# Patient Record
Sex: Male | Born: 1945 | Race: Black or African American | Hispanic: No | Marital: Single | Smoking: Former smoker
Health system: Southern US, Community
[De-identification: ages and names within clinical notes are randomized; demographics above are authoritative.]

## PROBLEM LIST (undated history)

## (undated) DIAGNOSIS — Z8546 Personal history of malignant neoplasm of prostate: Secondary | ICD-10-CM

## (undated) DIAGNOSIS — R32 Unspecified urinary incontinence: Secondary | ICD-10-CM

## (undated) DIAGNOSIS — J439 Emphysema, unspecified: Secondary | ICD-10-CM

## (undated) DIAGNOSIS — K7460 Unspecified cirrhosis of liver: Secondary | ICD-10-CM

## (undated) DIAGNOSIS — E785 Hyperlipidemia, unspecified: Secondary | ICD-10-CM

## (undated) DIAGNOSIS — M1990 Unspecified osteoarthritis, unspecified site: Secondary | ICD-10-CM

## (undated) DIAGNOSIS — D509 Iron deficiency anemia, unspecified: Secondary | ICD-10-CM

## (undated) DIAGNOSIS — M5137 Other intervertebral disc degeneration, lumbosacral region: Secondary | ICD-10-CM

## (undated) DIAGNOSIS — M51379 Other intervertebral disc degeneration, lumbosacral region without mention of lumbar back pain or lower extremity pain: Secondary | ICD-10-CM

## (undated) HISTORY — PX: INGUINAL HERNIA REPAIR: SUR1180

## (undated) HISTORY — DX: Unspecified cirrhosis of liver: K74.60

## (undated) HISTORY — DX: Iron deficiency anemia, unspecified: D50.9

---

## 1998-03-20 ENCOUNTER — Emergency Department (HOSPITAL_COMMUNITY): Admission: EM | Admit: 1998-03-20 | Discharge: 1998-03-20 | Payer: Self-pay | Admitting: Emergency Medicine

## 1998-08-25 ENCOUNTER — Encounter: Payer: Self-pay | Admitting: Emergency Medicine

## 1998-08-26 ENCOUNTER — Inpatient Hospital Stay (HOSPITAL_COMMUNITY): Admission: EM | Admit: 1998-08-26 | Discharge: 1998-08-27 | Payer: Self-pay | Admitting: Emergency Medicine

## 1998-08-26 ENCOUNTER — Encounter: Payer: Self-pay | Admitting: Gastroenterology

## 2000-07-18 ENCOUNTER — Ambulatory Visit (HOSPITAL_COMMUNITY): Admission: RE | Admit: 2000-07-18 | Discharge: 2000-07-19 | Payer: Self-pay | Admitting: Neurosurgery

## 2000-07-18 ENCOUNTER — Encounter: Payer: Self-pay | Admitting: Neurosurgery

## 2000-07-18 HISTORY — PX: ANTERIOR CERVICAL DECOMP/DISCECTOMY FUSION: SHX1161

## 2000-09-20 ENCOUNTER — Encounter: Payer: Self-pay | Admitting: Neurosurgery

## 2000-09-20 ENCOUNTER — Ambulatory Visit (HOSPITAL_COMMUNITY): Admission: RE | Admit: 2000-09-20 | Discharge: 2000-09-20 | Payer: Self-pay | Admitting: Neurosurgery

## 2000-09-30 ENCOUNTER — Emergency Department (HOSPITAL_COMMUNITY): Admission: EM | Admit: 2000-09-30 | Discharge: 2000-09-30 | Payer: Self-pay | Admitting: Emergency Medicine

## 2000-09-30 ENCOUNTER — Encounter: Payer: Self-pay | Admitting: Emergency Medicine

## 2001-10-31 ENCOUNTER — Emergency Department (HOSPITAL_COMMUNITY): Admission: EM | Admit: 2001-10-31 | Discharge: 2001-10-31 | Payer: Self-pay

## 2001-11-02 ENCOUNTER — Emergency Department (HOSPITAL_COMMUNITY): Admission: EM | Admit: 2001-11-02 | Discharge: 2001-11-02 | Payer: Self-pay | Admitting: Emergency Medicine

## 2001-11-09 ENCOUNTER — Emergency Department (HOSPITAL_COMMUNITY): Admission: EM | Admit: 2001-11-09 | Discharge: 2001-11-09 | Payer: Self-pay | Admitting: Emergency Medicine

## 2003-05-20 ENCOUNTER — Ambulatory Visit (HOSPITAL_COMMUNITY): Admission: RE | Admit: 2003-05-20 | Discharge: 2003-05-20 | Payer: Self-pay | Admitting: Orthopedic Surgery

## 2003-05-20 ENCOUNTER — Ambulatory Visit (HOSPITAL_BASED_OUTPATIENT_CLINIC_OR_DEPARTMENT_OTHER): Admission: RE | Admit: 2003-05-20 | Discharge: 2003-05-20 | Payer: Self-pay | Admitting: Orthopedic Surgery

## 2003-05-20 HISTORY — PX: WRIST ARTHROSCOPY WITH DEBRIDEMENT: SHX6194

## 2004-11-27 ENCOUNTER — Emergency Department (HOSPITAL_COMMUNITY): Admission: EM | Admit: 2004-11-27 | Discharge: 2004-11-27 | Payer: Self-pay | Admitting: Emergency Medicine

## 2006-01-30 HISTORY — PX: KNEE ARTHROSCOPY: SUR90

## 2006-09-24 ENCOUNTER — Encounter (HOSPITAL_COMMUNITY): Admission: RE | Admit: 2006-09-24 | Discharge: 2006-10-24 | Payer: Self-pay | Admitting: Urology

## 2006-12-05 ENCOUNTER — Encounter (INDEPENDENT_AMBULATORY_CARE_PROVIDER_SITE_OTHER): Payer: Self-pay | Admitting: Urology

## 2006-12-05 ENCOUNTER — Observation Stay (HOSPITAL_COMMUNITY): Admission: RE | Admit: 2006-12-05 | Discharge: 2006-12-06 | Payer: Self-pay | Admitting: Urology

## 2006-12-05 HISTORY — PX: ROBOT ASSISTED LAPAROSCOPIC RADICAL PROSTATECTOMY: SHX5141

## 2007-12-11 ENCOUNTER — Ambulatory Visit (HOSPITAL_COMMUNITY): Admission: RE | Admit: 2007-12-11 | Discharge: 2007-12-11 | Payer: Self-pay | Admitting: *Deleted

## 2007-12-19 ENCOUNTER — Ambulatory Visit (HOSPITAL_BASED_OUTPATIENT_CLINIC_OR_DEPARTMENT_OTHER): Admission: RE | Admit: 2007-12-19 | Discharge: 2007-12-19 | Payer: Self-pay | Admitting: *Deleted

## 2008-08-07 ENCOUNTER — Encounter: Admission: RE | Admit: 2008-08-07 | Discharge: 2008-08-07 | Payer: Self-pay | Admitting: Surgery

## 2008-08-11 ENCOUNTER — Ambulatory Visit (HOSPITAL_BASED_OUTPATIENT_CLINIC_OR_DEPARTMENT_OTHER): Admission: RE | Admit: 2008-08-11 | Discharge: 2008-08-11 | Payer: Self-pay | Admitting: Surgery

## 2008-10-26 ENCOUNTER — Ambulatory Visit (HOSPITAL_COMMUNITY): Admission: RE | Admit: 2008-10-26 | Discharge: 2008-10-26 | Payer: Self-pay | Admitting: Pulmonary Disease

## 2008-12-01 ENCOUNTER — Observation Stay (HOSPITAL_COMMUNITY): Admission: RE | Admit: 2008-12-01 | Discharge: 2008-12-02 | Payer: Self-pay | Admitting: Urology

## 2008-12-01 HISTORY — PX: OTHER SURGICAL HISTORY: SHX169

## 2009-01-29 ENCOUNTER — Emergency Department (HOSPITAL_COMMUNITY): Admission: EM | Admit: 2009-01-29 | Discharge: 2009-01-29 | Payer: Self-pay | Admitting: Emergency Medicine

## 2009-05-01 ENCOUNTER — Ambulatory Visit (HOSPITAL_COMMUNITY): Admission: RE | Admit: 2009-05-01 | Discharge: 2009-05-01 | Payer: Self-pay | Admitting: Pulmonary Disease

## 2009-07-14 ENCOUNTER — Emergency Department (HOSPITAL_COMMUNITY): Admission: EM | Admit: 2009-07-14 | Discharge: 2009-07-14 | Payer: Self-pay | Admitting: Emergency Medicine

## 2010-05-04 LAB — HEMOGLOBIN AND HEMATOCRIT, BLOOD
HCT: 32.8 % — ABNORMAL LOW (ref 39.0–52.0)
Hemoglobin: 11 g/dL — ABNORMAL LOW (ref 13.0–17.0)

## 2010-05-05 LAB — BASIC METABOLIC PANEL
BUN: 7 mg/dL (ref 6–23)
CO2: 31 mEq/L (ref 19–32)
Calcium: 9.4 mg/dL (ref 8.4–10.5)
Chloride: 105 mEq/L (ref 96–112)
Creatinine, Ser: 0.68 mg/dL (ref 0.4–1.5)
GFR calc Af Amer: >60 mL/min (ref >60)
GFR calc non Af Amer: >60 mL/min (ref >60)
Glucose, Bld: 84 mg/dL (ref 70–99)
Potassium: 4.3 mEq/L (ref 3.5–5.1)
Sodium: 141 mEq/L (ref 135–145)

## 2010-05-05 LAB — CBC
HCT: 36.5 % — ABNORMAL LOW (ref 39.0–52.0)
Hemoglobin: 12 g/dL — ABNORMAL LOW (ref 13.0–17.0)
MCHC: 32.9 g/dL (ref 30.0–36.0)
MCV: 90.4 fL (ref 78.0–100.0)
Platelets: 267 10*3/uL (ref 150–400)
RBC: 4.04 MIL/uL — ABNORMAL LOW (ref 4.22–5.81)
RDW: 14.1 % (ref 11.5–15.5)
WBC: 5.5 10*3/uL (ref 4.0–10.5)

## 2010-05-05 LAB — TYPE AND SCREEN
ABO/RH(D): A POS
Antibody Screen: NEGATIVE

## 2010-05-05 LAB — PROTIME-INR
INR: 0.99 (ref 0.00–1.49)
Prothrombin Time: 13 seconds (ref 11.6–15.2)

## 2010-05-05 LAB — APTT: aPTT: 37 seconds (ref 24–37)

## 2010-05-08 LAB — DIFFERENTIAL
Basophils Absolute: 0 10*3/uL (ref 0.0–0.1)
Basophils Relative: 0 % (ref 0–1)
Eosinophils Absolute: 0.2 10*3/uL (ref 0.0–0.7)
Eosinophils Relative: 4 % (ref 0–5)
Lymphocytes Relative: 31 % (ref 12–46)
Lymphs Abs: 1.5 10*3/uL (ref 0.7–4.0)
Monocytes Absolute: 0.3 10*3/uL (ref 0.1–1.0)
Monocytes Relative: 6 % (ref 3–12)
Neutro Abs: 2.9 10*3/uL (ref 1.7–7.7)
Neutrophils Relative %: 59 % (ref 43–77)

## 2010-05-08 LAB — CBC
HCT: 35.4 % — ABNORMAL LOW (ref 39.0–52.0)
Hemoglobin: 11.9 g/dL — ABNORMAL LOW (ref 13.0–17.0)
MCHC: 33.5 g/dL (ref 30.0–36.0)
MCV: 87.9 fL (ref 78.0–100.0)
Platelets: 266 10*3/uL (ref 150–400)
RBC: 4.03 MIL/uL — ABNORMAL LOW (ref 4.22–5.81)
RDW: 13.9 % (ref 11.5–15.5)
WBC: 4.9 10*3/uL (ref 4.0–10.5)

## 2010-05-08 LAB — BASIC METABOLIC PANEL
BUN: 6 mg/dL (ref 6–23)
CO2: 30 mEq/L (ref 19–32)
Calcium: 8.9 mg/dL (ref 8.4–10.5)
Chloride: 103 mEq/L (ref 96–112)
Creatinine, Ser: 0.78 mg/dL (ref 0.4–1.5)
GFR calc Af Amer: >60 mL/min (ref >60)
GFR calc non Af Amer: >60 mL/min (ref >60)
Glucose, Bld: 87 mg/dL (ref 70–99)
Potassium: 3.4 mEq/L — ABNORMAL LOW (ref 3.5–5.1)
Sodium: 141 mEq/L (ref 135–145)

## 2010-06-14 NOTE — Op Note (Signed)
[name], [name]              ACCOUNT NO.:  [id number]   MEDICAL RECORD NO.:  [id number]          PATIENT TYPE:  AMB   LOCATION:  NESC                         FACILITY:  [facility]   PHYSICIAN:  Alfonse Ras, MD   DATE OF BIRTH:  [birth year]   DATE OF PROCEDURE:  12/19/2007  DATE OF DISCHARGE:                               OPERATIVE REPORT   PREOPERATIVE DIAGNOSIS:  Left inguinal hernia.   POSTOPERATIVE DIAGNOSIS:  Left inguinal hernia, indirect.   PROCEDURE:  Left inguinal hernia repair with high ligation and placement  of 3 x 6 Ultrapro mesh.   ANESTHESIA:  General laryngeal mass.   SURGEON:  Baruch Merl, MD.   DESCRIPTION OF PROCEDURE:  The patient was taken to the operating room,  placed in a supine position.  After adequate general anesthesia was  induced using laryngeal mask, the left abdomen was prepped and draped in  the normal sterile fashion.  Using an oblique incision from the pubic  tubercle obliquely out lateral I dissected down to the skin and  subcutaneous tissue with Bovie electrocautery.  The external oblique  fascia was opened along its fibers.  The ilioinguinal nerve was  identified and retracted medially.  The spermatic cord was surrounded at  the external ring with a Penrose drain.  Indirect hernia sac was  identified and dissected off the cord and ligated at the internal ring  with a 0 Surgilon suture.  The internal ring was closed with an  interrupted 0 Surgilon suture approximating the transversalis fascia to  the inguinal ligament.  The Ultrapro mesh was then placed over the floor  of Hesselbach triangle and sutured from the pubic tubercle along the  inguinal ligament out lateral to the internal ring and superior medially  to the transversalis fascia with running 2-0 Prolene sutures.  The mesh  was split and brought out lateral to the internal ring.  The  ilioinguinal nerve was returned to its normal anatomic position.  The  external oblique fascia  was closed with a running 3-0 Vicryl.  All  tissues were injected with 0.5 Marcaine.  Skin was closed with staples.  Sterile dressing was applied.  The patient tolerated the procedure well,  went to PACU in good condition.      Alfonse Ras, MD  Electronically Signed     KRE/MEDQ  D:  12/19/2007  T:  12/20/2007  Job:  [id number]

## 2010-06-14 NOTE — Op Note (Signed)
NAMECHING, RABIDEAU              ACCOUNT NO.:  [id number]   MEDICAL RECORD NO.:  [id number]          PATIENT TYPE:  INP   LOCATION:  0006                         FACILITY:  [facility]   PHYSICIAN:  Valetta Fuller, M.D.  DATE OF BIRTH:  February 22, 1945   DATE OF PROCEDURE:  12/05/2006  DATE OF DISCHARGE:                               OPERATIVE REPORT   PREOPERATIVE DIAGNOSIS:  Clinical stage T1C adenocarcinoma of the  prostate.   POSTOPERATIVE DIAGNOSIS:  Clinical stage T1C adenocarcinoma of the  prostate.   PROCEDURE PERFORMED:  Robotic-assisted laparoscopic radical retropubic  prostatectomy with bilateral pelvic lymph node dissection.   SURGEON:  Valetta Fuller, M.D.   ASSISTANT:  Lucrezia Starch. Earlene Plater, M.D.   ANESTHESIA:  General endotracheal.   INDICATIONS:  Mr. Corkery is a 65 year old male.  He was diagnosed with  clinical stage T1C adenocarcinoma prostate by Dr. Marcine Matar.  The patient had a moderately elevated PSA of 8.1 with an unremarkable  digital rectal exam.  The patient had an ultrasound which showed a 20-25  gram prostate.  Pathology was mostly notable for positive biopsies in  the right side of his prostate.  He had Gleason 3 + 4 = 7 tumor of  moderate volume involving the right base, right mid and right apex.  The  left-sided biopsies were negative with the exception of the left apex  where a small less than 5% of the material was noted to have a Gleason 7  tumor.  The patient underwent extensive consultation with Dr. Retta Diones  and also myself about his treatment options.  He appeared to understand  the advantages and disadvantages of the various approaches.  He appeared  understand the issues related to radical surgery, especially with  regards to incontinence and sexual dysfunction.  All risks and benefits  were discussed, and full informed consent was obtained.  The patient has  received perioperative Unasyn and as well as compression stockings and  has done  a mechanical bowel prep.   TECHNIQUE AND FINDINGS:  The patient was brought to the operating room  where he had successful induction of general endotracheal anesthesia.  He was placed in the mid lithotomy position and prepped and draped in  the usual manner.  The patient's extremities were all carefully padded,  and he was secured to the operating table.  He was then placed in steep  Trendelenburg position and prepped and draped in the usual manner.  The  patient was noted to have fairly significant phimosis, but the foreskin  was retractable, and a Foley catheter was inserted sterilely on the  field.   The initial trocar site was placed through an open standard Hasson  technique.  Initial site was 18 cm above the pubic symphysis just to the  left of the umbilicus.  The peritoneal cavity was entered without  difficulty, and no obvious adhesions were noted.  Trocar was placed, and  the abdomen was insufflated without incident.  All other trocars were  placed with direct visual guidance including 12-mm and 5-mm assist ports  and three 8-mm robotic trocars.  The  surgical cart was then docked.   The bladder was then filled.  Incision was made superior to the bladder  utilizing hot electrocautery scissors, allowing for the bladder to drop  and for identification and dissection of the space of Retzius.  Endopelvic fascia over the prostate was defatted.  Endopelvic fascia was  then incised from apex to base on both sides.  Levator musculature was  swept off the apex of the prostate, and a dorsal vein complex was  isolated.  Puboprostatic ligaments were taken down sharply.  The dorsal  vein complex was then stapled with an ETS stapling device.  With a Foley  balloon, we were able to identify the anterior bladder neck which was  then incised with electrocautery scissors.  Foley catheter was found in  the midline.  There was no evidence of middle lobe.  The Foley catheter  was retracted  anteriorly.  Indigo carmine was given, and we were well  away from the orifices.  The posterior bladder neck was then incised.  The vas deferens and seminal vesicles were found bilaterally and  dissected free individually.  Clips were used on the tip of the seminal  vesicle.  The posterior plane between the prostate and rectum was then  easily established.   We felt that the patient could undergo left-sided nerve spare with wide  excision of the bundle on the right side.  The prostatic fascia was  incised in the left posterior lateral aspect.  The neurovascular bundle  was swept off the capsule the prostate from apex to base.  The prostate  was then lifted, and the pedicles were identified and taken down with  clips.  Again, the left side of the neurovascular bundle was preserved,  and we went as wide as possible on the right side, leaving more  additional tissue including the neurovascular bundle attached to the  right lateral aspect of the prostate.  With the Foley catheter in  position, the anterior urethra was then transected.  The Foley catheter  was retracted, and the urethra was completely transected leaving a nice  full-thickness stump.  A few loose attachments were then taken  carefully, and the prostate specimen was removed the pelvis.  Pelvic  irrigation was then performed.  There was no evidence of any rectal  injury.   Attention was then turned towards bilateral pelvic lymph node  dissection.  Obturator packets were taken up to the bifurcation of the  common iliac artery bilaterally.  Clips were used.  That tissue was  fairly scant, and there were no obvious pathologic lymph nodes.   Attention was then turned towards reconstruction.  Denonvilliers fascia  was reapproximated from underneath the urethra to the bladder base  utilizing Vicryl suture to help provide bladder support.  The posterior  lip of the bladder neck was then reapproximated to the 6 o'clock  urethral  stump with an interrupted Vicryl suture to reapproximate those  structures.  Again, indigo carmine was given, and we were well away from  the orifices.  The anastomosis was then completed with double-armed 3-0  Monocryl suture and a 360-degree running anastomosis.  New coude Foley  catheter was placed without difficulty.  The bladder was irrigated.  There was no bleeding, and the anastomosis appeared to be completely  watertight.  A drain was then placed in the pelvis through one of the  trocar ports.  This was secured to the skin.   Closure was then performed.  The prostate was  placed an Endopouch.  The  12-mm port site was closed with a 0 Vicryl with the aid of a suture  passer under direct visual guidance.  The midline incision/camera port  was slightly incised to allow for removal of the specimen.  That fascia  was then closed with running 0 Vicryl suture.  Other port sites were  infiltrated then closed with clips.  The patient appeared to tolerate  the procedure well.  Blood loss was minimal, and he was brought to  recovery room in stable condition.           ______________________________  Valetta Fuller, M.D.  Electronically Signed     DSG/MEDQ  D:  12/05/2006  T:  12/06/2006  Job:  [id number]

## 2010-06-14 NOTE — Op Note (Signed)
[name], [name]              ACCOUNT NO.:  [id number]   MEDICAL RECORD NO.:  [id number]          PATIENT TYPE:  AMB   LOCATION:  DSC                          FACILITY:  [facility]   PHYSICIAN:  Thomas A. Cornett, M.D.DATE OF BIRTH:  Mar 24, 1945   DATE OF PROCEDURE:  08/11/2008  DATE OF DISCHARGE:                               OPERATIVE REPORT   PREOPERATIVE DIAGNOSIS:  Right inguinal hernia.   POSTOPERATIVE DIAGNOSIS:  Indirect right inguinal hernia.   PROCEDURE:  Repair of right inguinal hernia with mesh.   SURGEON:  Maisie Fus A. Cornett, MD   ANESTHESIA:  LMA with 0.25% Sensorcaine local with epinephrine.   ESTIMATED BLOOD LOSS:  5 mL.   DRAINS:  None.   SPECIMENS:  None.   INDICATIONS FOR PROCEDURE:  The patient is a 65 year old male who  presents with a right inguinal hernia.  It is symptomatic and he wished  to have it repaired.  After discussion of the options in the office, he  wished to undergo right inguinal hernia repair as an open procedure.   We discussed the complications and risks of procedure to include  bleeding, infection, numbness, pain, hernia recurrence, testicular  discomfort or ischemia.   DESCRIPTION OF PROCEDURE:  The patient was brought to the operating room  and placed supine.  After induction of general anesthesia, the right  inguinal region was prepped and draped in sterile fashion.  An oblique  incision was made in the right inguinal crease.  Dissection was carried  down until the aponeurosis of the external oblique was encountered.  The  fibers were opened in the direction of the fibers to open the inguinal  canal.  Cord structures were then encircled with a 1-inch Penrose drain.  Dissection on the cord was then done which showed indirect sac which I  teased away from the cord and reduced it back into the preperitoneal  space without difficulty.  A large Prolene hernia system mesh was then  used with the inner leaflet placed in the preperitoneal  space and the  onlay placed in the floor of the inguinal canal.  The connecting between  the two pieces of mesh was secured to the internal oblique with 0-Vicryl  and the onlay tip was secured down to the pubic tubercle with 0-Vicryl.  #1 Novofils were used to secure the mesh to the shelving edge of the  inguinal ligament into the internal oblique and conjoined tendon.  A  slit was cut to the cord structures.  This was closed around the cord  structures and this was closed with #1 Novofil.  The mesh lay quite  nicely and closed the defect well with no tension.  There was ample room  for the tip of my fifth digit to be inserted along the cord structures.  Irrigation was used and suctioned out.  The ilioinguinal nerve was  divided as it came back to the muscle due to it being tethered along the  mesh as well as a branch of the iliohypogastric and a small branch of  this.  The general femoral nerve was not well seen.  The aponeurosis of  the external oblique was closed with 2-0 Vicryl and 3-0 Vicryl was used  to approximate the Scarpa fascia and 4-0  Monocryl was used to close the skin in a subcuticular fashion.  Dermabond was applied.  All final counts of sponge, needle, and  instrument were found to be correct at this portion of the case.  The  patient was then awoke and taken to recovery room in satisfactory  condition.      Thomas A. Cornett, M.D.  Electronically Signed     TAC/MEDQ  D:  08/11/2008  T:  08/12/2008  Job:  [id number]   cc:   Mina Marble, M.D.  Valetta Fuller, M.D.

## 2010-06-17 NOTE — Op Note (Signed)
NAME:  [name], [name]                        ACCOUNT NO.:  [id number]   MEDICAL RECORD NO.:  [id number]                   PATIENT TYPE:  AMB   LOCATION:  DSC                                  FACILITY:  [facility]   PHYSICIAN:  Artist Pais. Mina Marble, M.D.           DATE OF BIRTH:  07-Sep-1945   DATE OF PROCEDURE:  05/20/2003  DATE OF DISCHARGE:                                 OPERATIVE REPORT   PREOPERATIVE DIAGNOSES:  Right wrist triangular fibrocartilage complex tear  with retained hardware from a four-corner fusion.   POSTOPERATIVE DIAGNOSIS:  Right wrist triangular fibrocartilage complex tear  with retained hardware from a four-corner fusion.   PROCEDURE:  Right wrist arthroscopy with triangular fibrocartilage complex  tear debridement.  Open hardware removal, right wrist.   SURGEON:  Artist Pais. Mina Marble, M.D.   ASSISTANT:  __________   ANESTHESIA:  General.   TOURNIQUET TIME:  An hour.   COMPLICATIONS:  None.   DRAINS:  None.   OPERATIVE REPORT:  The patient was taken to the operating room after the  induction of adequate general anesthesia.  The right upper quadrant is  prepped and draped in the usual sterile fashion.  An Esmarch was used to  exsanguinate the limb.  The tourniquet was inflated to 250 mmHg.  At this  point in time,  the patient's right upper extremity is placed in the wrist  traction tower with 15 pounds of counter-traction across the radial carpal  joint, well padded.  A standard three-port arthroscopic portal was  established.  Once this was done, the scope was introduced into the joint.  Visualization revealed a TFCC tear.  A 4-5 working portal and a __________  portal were established under direct vision.  The TFCC tear was debrided  using a suction shaver and an ArthroCare wand down to the stable rim.  The  instruments were removed from the wrist.  The wrist was then removed from  the traction tower, fully pronated and placed on the operating room  table.  At this point in time the 3-4 portal was extended for a distance of 3 cm.  Once this was done, dissection was carried down through the 4th dorsal  compartment, which was retracted and the plate was identified.  It was  subperiosteally stripped and removed using the appropriate screwdriver.  The  plate was seen to be broken in the midpart.  The wound was then thoroughly  irrigated.  The capsule was repaired with 3-0 Ethibond, the retinaculum with  4-0 Vicryl and the skin with a running 3-0 Prolene subcuticular stitch.  Steri-Strips, 4 x 4s, fluffs, and a volar splint were applied.  The patient  tolerated the procedure well and went to the recovery room in stable  condition.  Artist Pais Mina Marble, M.D.   MAW/MEDQ  D:  05/20/2003  T:  05/20/2003  Job:  [id number]

## 2010-06-17 NOTE — Op Note (Signed)
[place]  Patient:    [name], [name]                     MRN: [id number] Proc. Date: 07/18/00 Adm. 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  OPERATION PERFORMED:  C3-4, C4-5 and C5-6 anterior cervical diskectomy and fusion with allograft anterior plate instrumentation.  SURGEON:  Julio Sicks, M.D.  ASSISTANT:  Reinaldo Meeker, M.D.  ANESTHESIA:  General orotracheal.  INDICATIONS FOR PROCEDURE:  The patient is a 65 year old black male with a history of neck pain and bilateral upper extremity pain and progressive weakness.  The patient also has a coexistent right-sided C6 radiculopathy. All of these symptoms have failed conservative management.  MRI scanning demonstrates severe stenosis at C3-4, C4-5 and C5-6.  The patient has moderate stenosis at C6-7.  We have discussed options for management including a three-level anterior cervical diskectomy and fusion with allograft and anterior plating for hopeful improvement of symptoms.  We discussed the risks and benefits involved with this procedure including but not limited to the risks of anesthesia, bleeding, infection, CSF leak, nerve root injury, spinal cord injury, fusion failure, instrumentation failure, dysphagia, ____________ continued pain and nonbenefit.  The patient has been given the opportunity to ask questions and appears to understand.  He wishes to proceed with surgery.  DESCRIPTION OF PROCEDURE:  The patient was taken to the operating room and placed on the table in supine position.  After an adequate level of anesthesia was achieved, patient was positioned supine with the neck slightly extended and held in place with halter traction.  The patients anterior cervical region was shaved  and prepped sterilely.  A 10 blade was used to make a linear skin incision extending from just right of midline to the medial border of the sternocleidomastoid muscle.  This was carried down sharply to the platysma. The platysma was then divided vertically.  Dissection proceeded along the medial border of the sternocleidomastoid muscle and carotid sheath.  The trachea and esophagus were mobilized and retracted towards the left.  The prevertebral fascia was stripped off the anterior spinal column.  The longus colli muscle was then elevated bilaterally using electrocautery.  Deep self-retaining retractor was placed.  Intraoperative fluoroscopy was used and the C3-4, C4-5 and C5-6 levels were confirmed.  Disk spaces at all three levels were then isolated and incised with a 15 blade in rectangular fashion. A wide disk space clean-out was then achieved using pituitary rongeurs, forward and backward angled Carlens curets, Kerrison rongeurs and high speed drill.  All elements of the disk were removed to the level of the posterior annulus at all three levels.  Anterior osteophytes were removed using Leksell rongeurs.  Microscope was brought into the field and used throughout the remainder of diskectomy.  Starting first at C5-6 the remaining aspects of the osteophytes were removed down to the level of the posterior longitudinal ligament.  The posterior longitudinal ligament was then elevated and resected in piecemeal fashion using Kerrison rongeurs.  The underlying thecal sac was identified.  A wide central decompression was then performed using Kerrison rongeurs.  Decompression then proceeded out into each C6 foramen.  The  C6 nerve root on the left side was identified and widely decompressed throughout its course.  The C6 nerve root on the right side was also identified and widely decompressed using Kerrison rongeurs throughout its course.  Attention was then placed to C4-5.  Once again, osteophytes  were removed using high speed drill and Kerrison rongeurs.  Posterior longitudinal ligament was  then elevated and resected in piecemeal fashion.  Underlying thecal sac was identified.  A wide central decompression was then performed undercutting the bodies of C4 and C5.  Decompression proceeded out to each neural foramen.  The C5 nerve root was identified bilaterally and widely decompressed.  The procedure was then repeated at the C3-4 level again without complications.  At all three levels after adequate decompression, the wound was then copiously irrigated with antibiotic solution.  Gelfoam was placed topically for hemostasis.  A 6 mm fibular wedge allograft was then placed in the C3-4, C4-5 and C5-6.  This was then recessed approximately 2 mm from the anterior cortical surface.  A 57 mm Atlantis anterior cervical plate was then placed over the C3, C4 and C5 levels.  This was then attached under fluoroscopic guidance by first drilling pilot holes, tapping the pilot holes and then subsequently placing variable angle 15 mm screws.  Two screws were placed at C3, C4 and C5 and C6.  All screws were given a final tightening and found to be solidly within bone.  Locking screws were engaged.  Retraction system was removed.  Final images revealed good position of the bone grafts and hardware at the proper operative level with normal alignment of the spine.  The wound was then inspected for hemostasis which was found to be good.  It was irrigated one final time.  It was then closed in typical fashion. Steri-Strips and sterile dressing were applied.  There were no apparent complications.  The patient tolerated the procedure well and he returned to the recovery room postoperatively. DD:  07/18/00 TD:  07/18/00 Job: [id number]

## 2010-06-30 ENCOUNTER — Emergency Department (HOSPITAL_COMMUNITY)
Admission: EM | Admit: 2010-06-30 | Discharge: 2010-06-30 | Disposition: A | Discharge status: Home / Self Care | Payer: Medicare Other | Attending: Emergency Medicine | Admitting: Emergency Medicine

## 2010-06-30 ENCOUNTER — Emergency Department (HOSPITAL_COMMUNITY): Payer: Medicare Other

## 2010-06-30 DIAGNOSIS — M25569 Pain in unspecified knee: Secondary | ICD-10-CM | POA: Insufficient documentation

## 2010-06-30 DIAGNOSIS — R55 Syncope and collapse: Secondary | ICD-10-CM | POA: Insufficient documentation

## 2010-06-30 DIAGNOSIS — E7800 Pure hypercholesterolemia, unspecified: Secondary | ICD-10-CM | POA: Insufficient documentation

## 2010-06-30 DIAGNOSIS — J438 Other emphysema: Secondary | ICD-10-CM | POA: Insufficient documentation

## 2010-06-30 DIAGNOSIS — M1990 Unspecified osteoarthritis, unspecified site: Secondary | ICD-10-CM | POA: Insufficient documentation

## 2010-06-30 LAB — DIFFERENTIAL
Basophils Absolute: 0 10*3/uL (ref 0.0–0.1)
Basophils Relative: 0 % (ref 0–1)
Eosinophils Absolute: 0.1 10*3/uL (ref 0.0–0.7)
Eosinophils Relative: 2 % (ref 0–5)
Lymphocytes Relative: 28 % (ref 12–46)
Lymphs Abs: 1.7 10*3/uL (ref 0.7–4.0)
Monocytes Absolute: 0.9 10*3/uL (ref 0.1–1.0)
Monocytes Relative: 15 % — ABNORMAL HIGH (ref 3–12)
Neutro Abs: 3.3 10*3/uL (ref 1.7–7.7)
Neutrophils Relative %: 55 % (ref 43–77)

## 2010-06-30 LAB — CBC
HCT: 36.4 % — ABNORMAL LOW (ref 39.0–52.0)
Hemoglobin: 12.4 g/dL — ABNORMAL LOW (ref 13.0–17.0)
MCH: 31.3 pg (ref 26.0–34.0)
MCHC: 34.1 g/dL (ref 30.0–36.0)
MCV: 91.9 fL (ref 78.0–100.0)
Platelets: 320 10*3/uL (ref 150–400)
RBC: 3.96 MIL/uL — ABNORMAL LOW (ref 4.22–5.81)
RDW: 13.3 % (ref 11.5–15.5)
WBC: 5.9 10*3/uL (ref 4.0–10.5)

## 2010-06-30 LAB — BASIC METABOLIC PANEL
CO2: 28 mEq/L (ref 19–32)
Calcium: 9.5 mg/dL (ref 8.4–10.5)
Creatinine, Ser: 0.87 mg/dL (ref 0.4–1.5)
GFR calc Af Amer: >60 mL/min (ref >60)

## 2010-06-30 LAB — URINALYSIS, ROUTINE W REFLEX MICROSCOPIC
Glucose, UA: NEGATIVE mg/dL
Hgb urine dipstick: NEGATIVE
Ketones, ur: NEGATIVE mg/dL
Nitrite: NEGATIVE
Protein, ur: NEGATIVE mg/dL
Specific Gravity, Urine: 1.022 (ref 1.005–1.030)
Urobilinogen, UA: 1 mg/dL (ref 0.0–1.0)
pH: 5.5 (ref 5.0–8.0)

## 2010-06-30 LAB — CK TOTAL AND CKMB (NOT AT ARMC)
CK, MB: 1.8 ng/mL (ref 0.3–4.0)
Total CK: 176 U/L (ref 7–232)

## 2010-07-02 LAB — URINE CULTURE

## 2010-08-08 ENCOUNTER — Ambulatory Visit (HOSPITAL_BASED_OUTPATIENT_CLINIC_OR_DEPARTMENT_OTHER)
Admission: RE | Admit: 2010-08-08 | Discharge: 2010-08-08 | Disposition: A | Discharge status: Home / Self Care | Payer: Medicare Other | Source: Ambulatory Visit | Attending: Urology | Admitting: Urology

## 2010-08-08 ENCOUNTER — Ambulatory Visit (HOSPITAL_COMMUNITY): Payer: Medicare Other | Attending: Urology

## 2010-08-08 DIAGNOSIS — N3941 Urge incontinence: Secondary | ICD-10-CM | POA: Insufficient documentation

## 2010-08-08 DIAGNOSIS — Z01812 Encounter for preprocedural laboratory examination: Secondary | ICD-10-CM | POA: Insufficient documentation

## 2010-08-08 HISTORY — PX: INTERSTIM IMPLANT PLACEMENT: SHX5130

## 2010-08-08 LAB — POCT I-STAT 4, (NA,K, GLUC, HGB,HCT): Potassium: 4.3 mEq/L (ref 3.5–5.1)

## 2010-08-16 NOTE — Op Note (Signed)
[name], [name]              ACCOUNT NO.:  [id number]  MEDICAL RECORD NO.:  [id number]  LOCATION:                                 FACILITY:  PHYSICIAN:  Martina Sinner, MD DATE OF BIRTH:  06-21-1945  DATE OF PROCEDURE: DATE OF DISCHARGE:                              OPERATIVE REPORT   PREOPERATIVE DIAGNOSIS:  Refractory urge incontinence.  POSTOPERATIVE DIAGNOSIS:  Refractory urge incontinence.  SURGERY:  InterStim stage I and II plus impedance check.  PROCEDURE IN DETAIL:  Mr. [name] has the above diagnosis.  He responded very favorably to the test stimulation.  He consented to the above procedure.  Preoperative antibiotics were given.  The patient was prepped and draped in usual fashion in the prone position.  Extra long scrub was utilized.  He is very thin, it was easy to mark landmarks.  I marked the S3 foramina fluoroscopically.  I then marked the entry site on his left side.  I easily found the S3 foramina with the regular length foramen needle.  I stimulated it and he had excellent bellows response, toe response, and he could feel sensation in the anus.  It only took approximately two try to get in the correct position and angle.  I removed the inner core of the foramen needle and placed the lead.  I made a 1-cm scalpel incision of appropriate depth.  I placed a white trocar to the appropriate depth over the guidewire.  I removed the guidewire in the center aspect of the white needle by the white trocar. I delivered the lead and tested.  In all 4 positions, he had the same motor and sensory response.  A little bit more energy was needed on lead position 3, minimal was used for lead position for 1 and 2.  Fluoroscopically, I marked a depth of the lead.  I removed the white introducer and inner wire of the lead and again checked motor and sensory responses with no change.  There is no change in position.  He is very thin.  I marked the left buttock  pocket a little bit lower than usual because he is exceptionally thin.  I made a 4.5 to 5 cm incision.  I dissected down approximately 0.5 cm through subcutaneous tissue and made an appropriate-sized pocket.  I used lidocaine between the two incisions.  I used the passer to deliver the lead from the midline incision at appropriate depth to the lateral incision.  I attached it appropriately to the [employer] generator.  The generator went in very nicely with the lead underneath the generator.  Impedance was then checked by the [employer] representative and all was normal.  Irrigation was utilized.  3-0 Vicryl subcutaneous and 4-0 subcuticular was used for the lateral incision and an interrupted 4-0 Vicryl was used for the medial incision.  Appropriate dressing was applied.  He did very well with the MAC anesthesia.  Hopefully, he will reach his treatment goal.          ______________________________ Martina Sinner, MD     SAM/MEDQ  D:  08/08/2010  T:  08/08/2010  Job:  [id number]  Electronically Signed by Alfredo Martinez MD  on 08/16/2010 08:14:41 AM

## 2010-11-01 LAB — COMPREHENSIVE METABOLIC PANEL
ALT: 12
Alkaline Phosphatase: 40
Alkaline Phosphatase: 41
BUN: 9
Chloride: 102
Chloride: 104
Creatinine, Ser: 0.75
GFR calc non Af Amer: >60
Glucose, Bld: 95
Glucose, Bld: 99
Potassium: 2.8 — ABNORMAL LOW
Potassium: 3 — ABNORMAL LOW
Sodium: 141
Total Bilirubin: 0.8
Total Bilirubin: 1
Total Protein: 6.6

## 2010-11-01 LAB — DIFFERENTIAL
Basophils Absolute: 0
Basophils Relative: 0
Basophils Relative: 0
Eosinophils Absolute: 0.2
Lymphocytes Relative: 40
Monocytes Absolute: 0.4
Monocytes Relative: 7
Neutro Abs: 2.2
Neutrophils Relative %: 49
Neutrophils Relative %: 51

## 2010-11-01 LAB — CBC
Hemoglobin: 11.2 — ABNORMAL LOW
MCHC: 33.5
RBC: 3.7 — ABNORMAL LOW
RBC: 3.8 — ABNORMAL LOW
RDW: 13.7
RDW: 13.8
WBC: 5.2

## 2010-11-01 LAB — RAPID URINE DRUG SCREEN, HOSP PERFORMED
Amphetamines: NOT DETECTED
Barbiturates: NOT DETECTED
Benzodiazepines: NOT DETECTED

## 2010-11-08 LAB — CBC
HCT: 33.7 — ABNORMAL LOW
Hemoglobin: 11.4 — ABNORMAL LOW
WBC: 4.8

## 2010-11-08 LAB — HEMOGLOBIN AND HEMATOCRIT, BLOOD
HCT: 33.5 — ABNORMAL LOW
Hemoglobin: 11.5 — ABNORMAL LOW

## 2010-11-08 LAB — BASIC METABOLIC PANEL
BUN: 5 — ABNORMAL LOW
Chloride: 100
GFR calc non Af Amer: >60
Glucose, Bld: 95
Potassium: 3.8
Potassium: 4
Sodium: 136

## 2010-11-08 LAB — DIFFERENTIAL
Eosinophils Relative: 1
Lymphocytes Relative: 18
Lymphs Abs: 0.8
Monocytes Absolute: 0.4

## 2010-11-08 LAB — TYPE AND SCREEN: Antibody Screen: NEGATIVE

## 2011-07-20 ENCOUNTER — Emergency Department (HOSPITAL_COMMUNITY): Payer: Medicare Other

## 2011-07-20 ENCOUNTER — Emergency Department (HOSPITAL_COMMUNITY)
Admission: EM | Admit: 2011-07-20 | Discharge: 2011-07-21 | Disposition: A | Discharge status: Home / Self Care | Payer: Medicare Other | Attending: Emergency Medicine | Admitting: Emergency Medicine

## 2011-07-20 ENCOUNTER — Encounter (HOSPITAL_COMMUNITY): Payer: Self-pay | Admitting: Emergency Medicine

## 2011-07-20 DIAGNOSIS — M545 Low back pain, unspecified: Secondary | ICD-10-CM | POA: Insufficient documentation

## 2011-07-20 DIAGNOSIS — Z79899 Other long term (current) drug therapy: Secondary | ICD-10-CM | POA: Insufficient documentation

## 2011-07-20 DIAGNOSIS — Z8546 Personal history of malignant neoplasm of prostate: Secondary | ICD-10-CM | POA: Insufficient documentation

## 2011-07-20 DIAGNOSIS — F10929 Alcohol use, unspecified with intoxication, unspecified: Secondary | ICD-10-CM

## 2011-07-20 DIAGNOSIS — R109 Unspecified abdominal pain: Secondary | ICD-10-CM | POA: Insufficient documentation

## 2011-07-20 DIAGNOSIS — F1010 Alcohol abuse, uncomplicated: Secondary | ICD-10-CM | POA: Insufficient documentation

## 2011-07-20 DIAGNOSIS — M546 Pain in thoracic spine: Secondary | ICD-10-CM | POA: Insufficient documentation

## 2011-07-20 LAB — COMPREHENSIVE METABOLIC PANEL
Albumin: 3.4 g/dL — ABNORMAL LOW (ref 3.5–5.2)
BUN: 5 mg/dL — ABNORMAL LOW (ref 6–23)
Chloride: 101 mEq/L (ref 96–112)
Creatinine, Ser: 0.82 mg/dL (ref 0.50–1.35)
GFR calc Af Amer: >90 mL/min (ref >90)
GFR calc non Af Amer: >90 mL/min (ref >90)
Total Bilirubin: 0.3 mg/dL (ref 0.3–1.2)

## 2011-07-20 LAB — URINALYSIS, ROUTINE W REFLEX MICROSCOPIC
Bilirubin Urine: NEGATIVE
Hgb urine dipstick: NEGATIVE
Ketones, ur: NEGATIVE mg/dL
Nitrite: NEGATIVE
Protein, ur: NEGATIVE mg/dL
Urobilinogen, UA: 0.2 mg/dL (ref 0.0–1.0)

## 2011-07-20 LAB — CBC
HCT: 42.3 % (ref 39.0–52.0)
MCHC: 33.3 g/dL (ref 30.0–36.0)
MCV: 80.7 fL (ref 78.0–100.0)
RDW: 17.7 % — ABNORMAL HIGH (ref 11.5–15.5)
WBC: 4.6 10*3/uL (ref 4.0–10.5)

## 2011-07-20 LAB — ETHANOL: Alcohol, Ethyl (B): 233 mg/dL — ABNORMAL HIGH (ref 0–11)

## 2011-07-20 MED ORDER — SODIUM CHLORIDE 0.9 % IV SOLN
INTRAVENOUS | Status: DC
  Administered 2011-07-21: via INTRAVENOUS

## 2011-07-20 NOTE — ED Notes (Addendum)
Pt reports pain started at 10am; pt reports having R flank described as stabbing; reports radiates between shoulder blades and having tingling down R arm; denies pain with urination; reports SOB and diaphoresis originally

## 2011-07-20 NOTE — ED Notes (Signed)
Up at lib to bathroom. 

## 2011-07-20 NOTE — ED Provider Notes (Signed)
History     CSN: [id number]  Arrival date & time 07/20/11  2037   First MD Initiated Contact with Patient 07/20/11 2303      Chief Complaint  Patient presents with  . Flank Pain    HPI Pt was seen at 2305.  Per pt, c/o gradual onset and persistence of constant right sided low back "pain" that began last night.  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  Denies direct injury to back.  Denies neck pain, no head injury, no CP/SOB, no abd pain, no N/V/D, no dysuria/hematuria.  Denies incont/retention of bowel or bladder, no saddle anesthesia, no focal motor weakness, no tingling/numbness in extremities, no fevers, no rash.      Past Medical History  Diagnosis Date  . Prostate cancer   . DJD (degenerative joint disease) of lumbar spine   . DJD (degenerative joint disease), cervical     Past Surgical History  Procedure Date  . Cervical spine surgery   . Wrist surgery   . Bladder surgery   . Prostatectomy     History  Substance Use Topics  . Smoking status: Former Games developer  . Smokeless tobacco: Current User    Types: Chew  . Alcohol Use: Yes     daily     Review of Systems ROS: Statement: All systems negative except as marked or noted in the HPI; Constitutional: Negative for fever and chills. ; ; Eyes: Negative for eye pain, redness and discharge. ; ; ENMT: Negative for ear pain, hoarseness, nasal congestion, sinus pressure and sore throat. ; ; Cardiovascular: Negative for chest pain, palpitations, diaphoresis, dyspnea and peripheral edema. ; ; Respiratory: Negative for cough, wheezing and stridor. ; ; Gastrointestinal: Negative for nausea, vomiting, diarrhea, abdominal pain, blood in stool, hematemesis, jaundice and rectal bleeding. . ; ; Genitourinary: Negative for dysuria, flank pain and hematuria. ; ; Musculoskeletal: +LBP. Negative for neck pain. Negative for swelling and trauma.; ; Skin: Negative for pruritus, rash,  abrasions, blisters, bruising and skin lesion.; ; Neuro: Negative for headache, lightheadedness and neck stiffness. Negative for weakness, altered level of consciousness , altered mental status, extremity weakness, paresthesias, involuntary movement, seizure and syncope.     Allergies  Review of patient's allergies indicates no known allergies.  Home Medications   Current Outpatient Rx  Name Route Sig Dispense Refill  . MIRABEGRON ER 25 MG PO TB24 Oral Take 25 mg by mouth daily.    Marland Kitchen POTASSIUM CHLORIDE CRYS ER 20 MEQ PO TBCR Oral Take 20 mEq by mouth daily.      BP 117/73  Pulse 91  Temp 97.7 F (36.5 C) (Oral)  Resp 19  SpO2 97%  Physical Exam 2310: Physical examination:  Nursing notes reviewed; Vital signs and O2 SAT reviewed;  Constitutional: Well developed, Well nourished, Well hydrated, In no acute distress; Head:  Normocephalic, atraumatic; Eyes: EOMI, PERRL, No scleral icterus; ENMT: Mouth and pharynx normal, Mucous membranes moist; Neck: Supple, Full range of motion, No lymphadenopathy; Cardiovascular: Regular rate and rhythm, No murmur, rub, or gallop; Respiratory: Breath sounds clear & equal bilaterally, No rales, rhonchi, wheezes.  Speaking full sentences with ease, Normal respiratory effort/excursion; Chest: Nontender, Movement normal; Abdomen: Soft, +mild mid-epigastric tenderness to palp, no rebound or guarding, Nondistended, Normal bowel sounds; Genitourinary: No CVA tenderness; Spine:  No midline CS, TS, LS tenderness.  +TTP right thoracic and lumbar paraspinal muscles; Extremities: Pulses normal, No tenderness, No edema, No  calf edema or asymmetry.; Neuro: AA&Ox3, Major CN grossly intact.  Speech clear.  Strength 5/5 equal bilat UE's and LE's, including great toe dorsiflexion.  DTR 2/4 equal bilat UE's and LE's.  No gross sensory deficits.  Neg straight leg raises bilat. Normal coordination, gait steady.;; Skin: Color normal, Warm, Dry, no rash.   ED Course  Procedures     MDM  MDM Reviewed: nursing note, vitals and previous chart Interpretation: labs, x-ray and CT scan   Results for orders placed during the hospital encounter of 07/20/11  CBC      Component Value Range   WBC 4.6  4.0 - 10.5 K/uL   RBC 5.24  4.22 - 5.81 MIL/uL   Hemoglobin 14.1  13.0 - 17.0 g/dL   HCT 16.1  09.6 - 04.5 %   MCV 80.7  78.0 - 100.0 fL   MCH 26.9  26.0 - 34.0 pg   MCHC 33.3  30.0 - 36.0 g/dL   RDW 40.9 (*) 81.1 - 91.4 %   Platelets 218  150 - 400 K/uL  COMPREHENSIVE METABOLIC PANEL      Component Value Range   Sodium 142  135 - 145 mEq/L   Potassium 3.6  3.5 - 5.1 mEq/L   Chloride 101  96 - 112 mEq/L   CO2 24  19 - 32 mEq/L   Glucose, Bld 93  70 - 99 mg/dL   BUN 5 (*) 6 - 23 mg/dL   Creatinine, Ser 7.82  0.50 - 1.35 mg/dL   Calcium 9.0  8.4 - 95.6 mg/dL   Total Protein 7.7  6.0 - 8.3 g/dL   Albumin 3.4 (*) 3.5 - 5.2 g/dL   AST 213 (*) 0 - 37 U/L   ALT 95 (*) 0 - 53 U/L   Alkaline Phosphatase 59  39 - 117 U/L   Total Bilirubin 0.3  0.3 - 1.2 mg/dL   GFR calc non Af Amer >90  >90 mL/min   GFR calc Af Amer >90  >90 mL/min  URINALYSIS, ROUTINE W REFLEX MICROSCOPIC      Component Value Range   Color, Urine YELLOW  YELLOW   APPearance CLEAR  CLEAR   Specific Gravity, Urine 1.014  1.005 - 1.030   pH 5.5  5.0 - 8.0   Glucose, UA NEGATIVE  NEGATIVE mg/dL   Hgb urine dipstick NEGATIVE  NEGATIVE   Bilirubin Urine NEGATIVE  NEGATIVE   Ketones, ur NEGATIVE  NEGATIVE mg/dL   Protein, ur NEGATIVE  NEGATIVE mg/dL   Urobilinogen, UA 0.2  0.0 - 1.0 mg/dL   Nitrite NEGATIVE  NEGATIVE   Leukocytes, UA NEGATIVE  NEGATIVE  ETHANOL      Component Value Range   Alcohol, Ethyl (B) 233 (*) 0 - 11 mg/dL  LIPASE, BLOOD      Component Value Range   Lipase 49  11 - 59 U/L   Dg Chest 2 View 07/21/2011  *RADIOLOGY REPORT*  Clinical Data: Right-sided chest pain and back pain.  CHEST - 2 VIEW  Comparison: Chest radiograph performed 06/30/2010  Findings: The lungs are  hyperexpanded, with underlying emphysema. No focal consolidation, pleural effusion or pneumothorax is seen. Minimal scarring is noted near the lung apices.  The heart is normal in size; the mediastinal contour is within normal limits.  No acute osseous abnormalities are seen.  Cervical spinal fusion hardware is noted.  IMPRESSION: Lungs hyperexpanded, with underlying emphysema and minimal biapical scarring.  No acute cardiopulmonary process seen.  Original  Report Authenticated By: Tonia Ghent, M.D.   Ct Abdomen Pelvis W Contrast 07/21/2011  *RADIOLOGY REPORT*  Clinical Data: Right flank pain, with pain extending to the shoulder blades and tingling down the right arm.  Shortness of breath and diaphoresis.  CT ABDOMEN AND PELVIS WITH CONTRAST  Technique:  Multidetector CT imaging of the abdomen and pelvis was performed following the standard protocol during bolus administration of intravenous contrast.  Contrast: OMNIPAQUE IOHEXOL 300 MG/ML  SOLN  Comparison: Lumbar spine radiographs performed 05/01/2009  Findings: Mild scarring is noted at the lung bases.  There is diffuse fatty infiltration within the liver, with a few areas of sparing.  The spleen is unremarkable in appearance.  The gallbladder is within normal limits.  The pancreas is unremarkable in appearance.  The adrenal glands are normal in appearance.  The kidneys are unremarkable in appearance.  There is no evidence of hydronephrosis.  No renal or ureteral stones are seen.  No perinephric stranding is appreciated.  No free fluid is identified.  The small bowel is unremarkable in appearance.  The stomach is within normal limits.  No acute vascular abnormalities are seen.  Minimal calcification is noted along the abdominal aorta and its branches.  The appendix normal in caliber and contains air, without evidence for appendicitis.  Scattered diverticulosis is noted along the ascending colon, without evidence of diverticulitis.  The colon is otherwise  unremarkable in appearance.  The bladder is mildly distended and grossly unremarkable in appearance.  The prostate has been resected.  No inguinal lymphadenopathy is seen.  A metallic stimulation device is noted at the left flank, with a single lead ending at the left hemipelvis.  No acute osseous abnormalities are identified.  There is mild multilevel disc space narrowing along the mid lumbar spine.  IMPRESSION:  1.  No acute abnormalities seen within the abdomen or pelvis. 2.  Diffuse fatty infiltration within the liver. 3.  Scattered diverticulosis along the ascending colon, without evidence of diverticulitis.  Original Report Authenticated By: Tonia Ghent, M.D.       3:58 AM:  Pt has been sleeping most of ED stay.  Pt's ride is here now and family wants to take pt home.  Pt wants to go home now.  Pain appears msk at this time.  I am concerned regarding pt's etoh abuse and rx narcotic pain meds.  Will tx with non-sedating muscle relaxant only.  Dx testing d/w pt and family.  Questions answered.  Verb understanding, agreeable to d/c home with outpt f/u.    The patient appears reasonably screened and/or stabilized for discharge and I doubt any other medical condition or other [facility] requiring further screening, evaluation, or treatment in the ED at this time prior to discharge.     Laray Anger, DO 07/22/11 1516

## 2011-07-21 ENCOUNTER — Emergency Department (HOSPITAL_COMMUNITY): Payer: Medicare Other

## 2011-07-21 ENCOUNTER — Encounter (HOSPITAL_COMMUNITY): Payer: Self-pay | Admitting: Emergency Medicine

## 2011-07-21 LAB — URINE CULTURE: Culture: NO GROWTH

## 2011-07-21 MED ORDER — FENTANYL CITRATE 0.05 MG/ML IJ SOLN
50.0000 ug | INTRAMUSCULAR | Status: DC | PRN
  Administered 2011-07-21: 50 ug via INTRAVENOUS
  Filled 2011-07-21: qty 2

## 2011-07-21 MED ORDER — IOHEXOL 300 MG/ML  SOLN
20.0000 mL | INTRAMUSCULAR | Status: AC
  Administered 2011-07-21: 20 mL via ORAL

## 2011-07-21 MED ORDER — IOHEXOL 300 MG/ML  SOLN
100.0000 mL | Freq: Once | INTRAMUSCULAR | Status: AC | PRN
  Administered 2011-07-21: 100 mL via INTRAVENOUS

## 2011-07-21 MED ORDER — METHOCARBAMOL 500 MG PO TABS: 1000.0000 mg | ORAL_TABLET | Freq: Four times a day (QID) | ORAL | Status: AC | PRN

## 2011-07-21 NOTE — ED Notes (Signed)
Right side flank pain suddenly started around 2200, getting worse, dis take anything at home. Denies any other symptoms.

## 2011-07-21 NOTE — Discharge Instructions (Signed)
RESOURCE GUIDE  Chronic Pain Problems: Contact [facility]  [phone] Patients need to be referred by their primary care doctor.  Insufficient Money for Medicine: Contact United Way:  call "211" or [facility] [phone].  No Primary Care Doctor: - Call [facility]  [phone] - can help you locate a primary care doctor that  accepts your insurance, provides certain services, etc. - Physician Referral Service- 1-800-533-3463  Agencies that provide inexpensive medical care: - [facility]  [phone] - [facility]  [phone] - [facility]  [phone] - [facility] Clinic  [phone] - Planned Parenthood  [phone] - [facility]  [phone]  Medicaid-accepting [place] Providers: - [facility]- [address]  [phone], Mon-Fri 9am-7pm, Sat 9am-1pm - [facility]- [address], [address]  [phone] - [facility]- [address], [address]  [phone] - [facility]- [address] [place] [address]  [phone] - Veita Bland- [address], [phone]  Only accepts [place] Access Medicaid patients after they have their name  applied to their card  Self Pay (no insurance) in [place]: - Sickle Cell Patients: Dr Eric Dean, [facility]  [address], [phone] - [facility]- [address]  [phone]       -     [facility] [place]- [address]       -     [facility]- see information above (Speak to Pam H if you do not have insurance)       -  [facility]- [address], [phone]       -  [facility] [place]- [address],  [phone]       -  [facility]- [address] [place] [address], [phone]       -  Dr Osei-Bonsu-  [address], [place], [phone]       -  [facility]- [address], [phone]       -  [facility]- [address] [place] [address], [phone], also [address], [phone]       -    [facility]- [address], [phone], 1st & 3rd Saturday   every month, 10am-1pm  1) Find a Doctor and Pay Out of Pocket Although you won't have to find out who is covered by your insurance plan, it is a good idea to ask around and get recommendations. You will then need to call the office and see if the doctor you have chosen will accept you as a new patient and what types of options they offer for patients who are self-pay. Some doctors offer discounts or will set up payment plans for their patients who do not have insurance, but you will need to ask so you aren't surprised when you get to your appointment.  2) Contact Your Local Health Department Not all health departments have doctors that can see patients for sick visits, but many do, so it is worth a call to see if yours does. If you don't know where your local health department is, you can check in your phone book. The CDC also has a tool to help you locate your state's health department, and many state websites also have   listings of all of their local health departments.  3) Find a Walk-in Clinic If your illness is not likely to be very severe or complicated, you may want to try a walk in clinic. These are popping up all over the country in pharmacies, drugstores, and shopping centers. They're usually staffed by nurse practitioners or physician assistants that have been trained to treat common illnesses and complaints. They're usually fairly quick and inexpensive. However, if you have serious medical issues or chronic medical problems, these are probably not your best option  STD Testing - [place] [facility] [place], [facility], [address], [place], phone [phone] or 1-877-539-9860.  Monday - Friday, call for an appointment. - [place]  [facility] [place], [facility] Clinic, [address], [place], phone [phone] or [phone].  Monday - Friday, call for an appointment.  Abuse/Neglect: - [place] Child Abuse Hotline (336) 641-3795 - [place] Child Abuse Hotline 800-378-5315 (After Hours)  Emergency Shelter:  [facility] (336) 271-5985  Maternity Homes: - [facility] (336) 275-9566 - [facility] (704) 372-4663  MRSA Hotline #:   832-7006  [place] Resources  [facility] [place]  [employer] [place] [facility] [address]                 [address]         [address]  [place]                                               [place]                              [place] Phone:  [phone]                                  Phone:  [phone]                   Phone:  [phone]  [place] [facility], [phone] - [place] Services - [facility]- 1-888-581-9988       -     [facility] in [place], [address],                                  336-349-4454, Insurance  [place] Child Abuse Hotline (336) 342-1394 or (336) 342-3537 (After Hours)   Behavioral Health Services  Substance Abuse Resources: - [facility]  336-882-2125 - [facility] 336-784-9470 - [facility] 336-285-9073 - [facility] 336-845-3988 - Residential & Outpatient Substance Abuse Program  800-659-3381  Psychological Services: -  Health  [phone] - [facility]  [phone] - [place] [facility], [address], [place], ACCESS LINE: 1-800-853-5163 or 336-641-4981, Http://www.[facility].com/services/adult.htm  Dental Assistance  If unable to pay or uninsured, contact:  [facility] or [place] [facility] to become qualified for the adult dental  clinic.  Patients with Medicaid:  [facility] [address], [phone], [phone]  If unable   to pay, or uninsured, contact [facility] (712)673-8640) or [facility] Department (801) 022-4480 in [place], [phone] in [facility]) to become qualified for the adult dental clinic  Other Low-Cost Community Dental Services: - [facility]- [address] [place], [place], [place], [address], [phone], [phone], 2nd and 4th Thursday of the month at 6:30am.  10 clients each day by appointment, can sometimes see walk-in patients if someone does not show for an appointment. [facility]- [address] [place], [place], [address], [phone] - [facility]- [address], [place], [place], [address], [phone] [facility]- [phone] [facility] Health Department- [phone] [facility] [phone]     Take the prescription as directed.  Apply moist heat or ice to the area(s) of discomfort, for 15 minutes at a time, several times per day for the next few days.  Do not fall asleep on a heating or ice pack.  Call your regular medical doctor today to schedule a follow up appointment within the next week.  Return to the Emergency Department immediately if worsening.

## 2011-07-21 NOTE — ED Notes (Signed)
Update given to family and pt. Pt concerned that he has not had his heart medication yet tonight, Dr. Lovell Sheehan at bedside and informed of concerns.

## 2013-04-25 ENCOUNTER — Other Ambulatory Visit: Payer: Self-pay | Admitting: Urology

## 2013-05-13 ENCOUNTER — Encounter (HOSPITAL_BASED_OUTPATIENT_CLINIC_OR_DEPARTMENT_OTHER): Payer: Self-pay | Admitting: *Deleted

## 2013-05-14 ENCOUNTER — Other Ambulatory Visit (HOSPITAL_COMMUNITY): Payer: Self-pay | Admitting: Pulmonary Disease

## 2013-05-14 ENCOUNTER — Ambulatory Visit (HOSPITAL_COMMUNITY)
Admission: RE | Admit: 2013-05-14 | Discharge: 2013-05-14 | Disposition: A | Discharge status: Home / Self Care | Payer: Medicare Other | Source: Ambulatory Visit | Attending: Pulmonary Disease | Admitting: Pulmonary Disease

## 2013-05-14 DIAGNOSIS — R52 Pain, unspecified: Secondary | ICD-10-CM

## 2013-05-14 DIAGNOSIS — Z981 Arthrodesis status: Secondary | ICD-10-CM | POA: Insufficient documentation

## 2013-05-14 DIAGNOSIS — M542 Cervicalgia: Secondary | ICD-10-CM | POA: Insufficient documentation

## 2013-05-14 DIAGNOSIS — M25519 Pain in unspecified shoulder: Secondary | ICD-10-CM | POA: Insufficient documentation

## 2013-05-15 ENCOUNTER — Encounter (HOSPITAL_BASED_OUTPATIENT_CLINIC_OR_DEPARTMENT_OTHER): Payer: Self-pay | Admitting: *Deleted

## 2013-05-15 NOTE — Progress Notes (Signed)
NPO AFTER MN , PT VERBALIZED UNDERSTANDING THIS INCLUDES NO CHEW TOBACCO. ARRIVE AT 0930. NEEDS HG.

## 2013-05-19 NOTE — H&P (Signed)
History of Present Illness   Mr [name] has worsening urge incontinence and frequency. He had persistent urge incontinence and frequency after a male sling, which did not surprise us. He actually did beautifully initially with Interstim. In the last few months things have been worsening.   Natalia LeatherwoodKatherine came in today and evaluated the device. In all 4 leads, the impedance was greater than 4000, representing malfunction. The battery demonstrates 108 months, which I think is a false reading.   Review of Systems: No change in bowel or neurologic systems.   Urinalysis: I reviewed, negative.    Past Medical History Problems  1. History of Arthritis (V13.4) 2. Personal history of prostate cancer (V10.46)  Surgical History Problems  1. History of Install Sacral Nerve Neurostimulator By Incision 2. History of Knee Surgery 3. History of Neck Surgery 4. History of Operation For Correction Of Male Urinary Incontinence 5. History of Peripheral Nerve Neurostimulator Placement Of Receiver 6. History of Prostatectomy Retropubic Radical With Nerve Sparing 7. History of Wrist Surgery  Current Meds 1. Myrbetriq 50 MG Oral Tablet Extended Release 24 Hour; Take 1 tablet daily;  Therapy: 28Jun2013 to (Evaluate:18Jun2015)  Requested for: 24Jun2014; Last  Rx:23Jun2014 Ordered 2. Potassium Chloride Crys ER 20 MEQ Oral Tablet Extended Release;  Therapy: 11Dec2012 to Recorded 3. Prostaglandin E1 20ug/mL; 1ml in 1ml syringe;  Therapy: 30Oct2014 to (Last Rx:30Oct2014) Ordered 4. Prostaglandin E1 20ug/mL; Inject 60-100 units/ injection;  Therapy: 17Feb2015 to (Last Rx:17Feb2015) Ordered  Allergies Medication  1. No Known Drug Allergies  Family History Problems  1. Family history of Gunshot Wound : Father  Social History Problems  1. Alcohol Use   3 beers a day 2. Chews tobacco (305.1) 3. Family history of Death In The Family Father 4. Family history of Death In The Family Mother 5. Former  smoker (V15.82) 6. Occupation:   works [employer] [place] 7. History of Tobacco Use (V15.82)   smoked  as a teen only  Results/Data  Urine [Data Includes: Last 1 Day]   24Mar2015  COLOR YELLOW   APPEARANCE CLEAR   SPECIFIC GRAVITY 1.020   pH 7.0   GLUCOSE NEG mg/dL  BILIRUBIN NEG   KETONE NEG mg/dL  BLOOD NEG   PROTEIN NEG mg/dL  UROBILINOGEN 1 mg/dL  NITRITE NEG   LEUKOCYTE ESTERASE NEG    Assessment Assessed  1. Increased urinary frequency (788.41) 2. Urge and stress incontinence (788.33)  Plan Increased urinary frequency  1. Follow-up Schedule Surgery Office  Follow-up  Status: Complete  Done: 24Mar2015  Discussion/Summary   Mr [name] and I spoke about replacing his Interstim. His incisions look fine and the device is in the left upper buttock. I talked about leaving it versus watchful waiting versus replacement.  Pros, cons, success and failure rates of Interstim were discussed. We talked about the test stimulation (office/operating room) and the second stage procedure. Risks were described but not limited to the risk of persistent, de novo, or worsening incontinence. Risks of pain, bleeding, infection, and neuropathy were discussed. Risk of malfunction, migration, and breakage were discussed. Trouble-shooting, battery life, and the need for explanation and reoperation were discussed. MRI issues were discussed. The patient understands that she might not reach her treatment goal and that she might be worse following surgery.  He would like the device replaced.  He understands that I will do the best I can, but I cannot promise that it will work equally as well as the last. We also talked about retained tip  issues moving forward.  After a thorough review of the management options for the patient's condition the patient  elected to proceed with surgical therapy as noted above. We have discussed the potential benefits and risks of the procedure, side effects of the  proposed treatment, the likelihood of the patient achieving the goals of the procedure, and any potential problems that might occur during the procedure or recuperation. Informed consent has been obtained.

## 2013-05-20 ENCOUNTER — Ambulatory Visit (HOSPITAL_COMMUNITY): Payer: Medicare Other

## 2013-05-20 ENCOUNTER — Encounter (HOSPITAL_BASED_OUTPATIENT_CLINIC_OR_DEPARTMENT_OTHER): Payer: Self-pay | Admitting: *Deleted

## 2013-05-20 ENCOUNTER — Encounter (HOSPITAL_BASED_OUTPATIENT_CLINIC_OR_DEPARTMENT_OTHER): Payer: Medicare Other | Admitting: Anesthesiology

## 2013-05-20 ENCOUNTER — Encounter (HOSPITAL_BASED_OUTPATIENT_CLINIC_OR_DEPARTMENT_OTHER)
Admission: RE | Disposition: A | Discharge status: Home / Self Care | Payer: Self-pay | Source: Ambulatory Visit | Attending: Urology

## 2013-05-20 ENCOUNTER — Ambulatory Visit (HOSPITAL_BASED_OUTPATIENT_CLINIC_OR_DEPARTMENT_OTHER)
Admission: RE | Admit: 2013-05-20 | Discharge: 2013-05-20 | Disposition: A | Discharge status: Home / Self Care | Payer: Medicare Other | Source: Ambulatory Visit | Attending: Urology | Admitting: Urology

## 2013-05-20 ENCOUNTER — Ambulatory Visit (HOSPITAL_BASED_OUTPATIENT_CLINIC_OR_DEPARTMENT_OTHER): Payer: Medicare Other | Admitting: Anesthesiology

## 2013-05-20 DIAGNOSIS — Y831 Surgical operation with implant of artificial internal device as the cause of abnormal reaction of the patient, or of later complication, without mention of misadventure at the time of the procedure: Secondary | ICD-10-CM | POA: Insufficient documentation

## 2013-05-20 DIAGNOSIS — R350 Frequency of micturition: Secondary | ICD-10-CM | POA: Insufficient documentation

## 2013-05-20 DIAGNOSIS — Z8546 Personal history of malignant neoplasm of prostate: Secondary | ICD-10-CM | POA: Insufficient documentation

## 2013-05-20 DIAGNOSIS — T85695A Other mechanical complication of other nervous system device, implant or graft, initial encounter: Secondary | ICD-10-CM | POA: Insufficient documentation

## 2013-05-20 DIAGNOSIS — M129 Arthropathy, unspecified: Secondary | ICD-10-CM | POA: Insufficient documentation

## 2013-05-20 DIAGNOSIS — N3946 Mixed incontinence: Secondary | ICD-10-CM | POA: Insufficient documentation

## 2013-05-20 DIAGNOSIS — Z9079 Acquired absence of other genital organ(s): Secondary | ICD-10-CM | POA: Insufficient documentation

## 2013-05-20 DIAGNOSIS — Z79899 Other long term (current) drug therapy: Secondary | ICD-10-CM | POA: Insufficient documentation

## 2013-05-20 HISTORY — DX: Unspecified urinary incontinence: R32

## 2013-05-20 HISTORY — DX: Other intervertebral disc degeneration, lumbosacral region without mention of lumbar back pain or lower extremity pain: M51.379

## 2013-05-20 HISTORY — DX: Personal history of malignant neoplasm of prostate: Z85.46

## 2013-05-20 HISTORY — DX: Unspecified osteoarthritis, unspecified site: M19.90

## 2013-05-20 HISTORY — DX: Other intervertebral disc degeneration, lumbosacral region: M51.37

## 2013-05-20 HISTORY — DX: Hyperlipidemia, unspecified: E78.5

## 2013-05-20 HISTORY — DX: Emphysema, unspecified: J43.9

## 2013-05-20 HISTORY — PX: INTERSTIM IMPLANT PLACEMENT: SHX5130

## 2013-05-20 LAB — POCT HEMOGLOBIN-HEMACUE: Hemoglobin: 13.1 g/dL (ref 13.0–17.0)

## 2013-05-20 SURGERY — INSERTION, SACRAL NERVE STIMULATOR, INTERSTIM, STAGE 1
Anesthesia: General | Site: Back

## 2013-05-20 MED ORDER — LIDOCAINE HCL 1 % IJ SOLN
INTRAMUSCULAR | Status: DC | PRN
  Administered 2013-05-20: 40 mg via INTRADERMAL

## 2013-05-20 MED ORDER — CEPHALEXIN 250 MG PO CAPS: 250.0000 mg | ORAL_CAPSULE | Freq: Three times a day (TID) | ORAL | Status: DC

## 2013-05-20 MED ORDER — CEFAZOLIN SODIUM-DEXTROSE 2-3 GM-% IV SOLR
2.0000 g | INTRAVENOUS | Status: AC
  Administered 2013-05-20: 2 g via INTRAVENOUS
  Filled 2013-05-20: qty 50

## 2013-05-20 MED ORDER — MIDAZOLAM HCL 5 MG/5ML IJ SOLN
INTRAMUSCULAR | Status: DC | PRN
  Administered 2013-05-20: 2 mg via INTRAVENOUS

## 2013-05-20 MED ORDER — HYDROCODONE-ACETAMINOPHEN 5-325 MG PO TABS
1.0000 | ORAL_TABLET | Freq: Four times a day (QID) | ORAL | Status: DC | PRN
  Administered 2013-05-20: 1 via ORAL
  Filled 2013-05-20: qty 1

## 2013-05-20 MED ORDER — HYDROCODONE-ACETAMINOPHEN 5-325 MG PO TABS: 1.0000 | ORAL_TABLET | Freq: Four times a day (QID) | ORAL | Status: DC | PRN

## 2013-05-20 MED ORDER — BUPIVACAINE-EPINEPHRINE 0.5% -1:200000 IJ SOLN
INTRAMUSCULAR | Status: DC | PRN
  Administered 2013-05-20: 10.5 mL

## 2013-05-20 MED ORDER — HYDROCODONE-ACETAMINOPHEN 5-325 MG PO TABS
ORAL_TABLET | ORAL | Status: AC
  Filled 2013-05-20: qty 1

## 2013-05-20 MED ORDER — ONDANSETRON HCL 4 MG/2ML IJ SOLN
INTRAMUSCULAR | Status: DC | PRN
  Administered 2013-05-20: 4 mg via INTRAVENOUS

## 2013-05-20 MED ORDER — LIDOCAINE-EPINEPHRINE (PF) 1 %-1:200000 IJ SOLN
INTRAMUSCULAR | Status: DC | PRN
  Administered 2013-05-20: 10.5 mL

## 2013-05-20 MED ORDER — MEPERIDINE HCL 25 MG/ML IJ SOLN
6.2500 mg | INTRAMUSCULAR | Status: DC | PRN
  Filled 2013-05-20: qty 1

## 2013-05-20 MED ORDER — OXYCODONE HCL 5 MG/5ML PO SOLN
5.0000 mg | Freq: Once | ORAL | Status: DC | PRN
  Filled 2013-05-20: qty 5

## 2013-05-20 MED ORDER — PROMETHAZINE HCL 25 MG/ML IJ SOLN
6.2500 mg | INTRAMUSCULAR | Status: DC | PRN
Start: 2013-05-20 — End: 2013-05-20
  Filled 2013-05-20: qty 1

## 2013-05-20 MED ORDER — MIDAZOLAM HCL 2 MG/2ML IJ SOLN
INTRAMUSCULAR | Status: AC
  Filled 2013-05-20: qty 2

## 2013-05-20 MED ORDER — FENTANYL CITRATE 0.05 MG/ML IJ SOLN
INTRAMUSCULAR | Status: DC | PRN
  Administered 2013-05-20: 50 ug via INTRAVENOUS
  Administered 2013-05-20 (×2): 25 ug via INTRAVENOUS

## 2013-05-20 MED ORDER — HYDROMORPHONE HCL PF 1 MG/ML IJ SOLN
0.2500 mg | INTRAMUSCULAR | Status: DC | PRN
  Administered 2013-05-20: 0.25 mg via INTRAVENOUS
  Filled 2013-05-20: qty 1

## 2013-05-20 MED ORDER — PROPOFOL 10 MG/ML IV BOLUS
INTRAVENOUS | Status: DC | PRN
  Administered 2013-05-20 (×5): 10 mg via INTRAVENOUS
  Administered 2013-05-20: 20 mg via INTRAVENOUS

## 2013-05-20 MED ORDER — LACTATED RINGERS IV SOLN
INTRAVENOUS | Status: DC
  Administered 2013-05-20 (×2): via INTRAVENOUS
  Filled 2013-05-20: qty 1000

## 2013-05-20 MED ORDER — HYDROMORPHONE HCL PF 1 MG/ML IJ SOLN
INTRAMUSCULAR | Status: AC
  Filled 2013-05-20: qty 1

## 2013-05-20 MED ORDER — OXYCODONE HCL 5 MG PO TABS
5.0000 mg | ORAL_TABLET | Freq: Once | ORAL | Status: DC | PRN
Start: 2013-05-20 — End: 2013-05-20
  Filled 2013-05-20: qty 1

## 2013-05-20 MED ORDER — FENTANYL CITRATE 0.05 MG/ML IJ SOLN
INTRAMUSCULAR | Status: AC
  Filled 2013-05-20: qty 4

## 2013-05-20 SURGICAL SUPPLY — 69 items
ADH SKN CLS APL DERMABOND .7 (GAUZE/BANDAGES/DRESSINGS) ×1
ANTNA NRSTM XTRN TELEM NS LF (UROLOGICAL SUPPLIES) ×1
APL SKNCLS STERI-STRIP NONHPOA (GAUZE/BANDAGES/DRESSINGS) ×2
BAG URINE DRAINAGE (UROLOGICAL SUPPLIES) ×3 IMPLANT
BAG URINE LEG 500ML (DRAIN) IMPLANT
BANDAGE ADHESIVE 1X3 (GAUZE/BANDAGES/DRESSINGS) IMPLANT
BENZOIN TINCTURE PRP APPL 2/3 (GAUZE/BANDAGES/DRESSINGS) ×6 IMPLANT
BLADE HEX COATED 2.75 (ELECTRODE) ×3 IMPLANT
BLADE SURG 15 STRL LF DISP TIS (BLADE) ×1 IMPLANT
BLADE SURG 15 STRL SS (BLADE) ×3
CATH FOLEY 2WAY SLVR  5CC 16FR (CATHETERS) ×2
CATH FOLEY 2WAY SLVR 5CC 16FR (CATHETERS) ×1 IMPLANT
CLOSURE WOUND 1/2 X4 (GAUZE/BANDAGES/DRESSINGS) ×1
CLOTH BEACON ORANGE TIMEOUT ST (SAFETY) ×3 IMPLANT
COVER MAYO STAND STRL (DRAPES) ×3 IMPLANT
COVER PROBE W GEL 5X96 (DRAPES) ×3 IMPLANT
COVER TABLE BACK 60X90 (DRAPES) ×3 IMPLANT
DERMABOND ADVANCED (GAUZE/BANDAGES/DRESSINGS) ×2
DERMABOND ADVANCED .7 DNX12 (GAUZE/BANDAGES/DRESSINGS) ×1 IMPLANT
DRAPE C-ARM 42X72 X-RAY (DRAPES) ×6 IMPLANT
DRAPE INCISE 23X17 IOBAN STRL (DRAPES) ×2
DRAPE INCISE 23X17 STRL (DRAPES) ×1 IMPLANT
DRAPE INCISE IOBAN 23X17 STRL (DRAPES) ×1 IMPLANT
DRAPE LAPAROSCOPIC ABDOMINAL (DRAPES) ×3 IMPLANT
DRAPE LG THREE QUARTER DISP (DRAPES) ×3 IMPLANT
DRESSING TELFA 8X3 (GAUZE/BANDAGES/DRESSINGS) ×3 IMPLANT
DRSG TEGADERM 2-3/8X2-3/4 SM (GAUZE/BANDAGES/DRESSINGS) ×2 IMPLANT
DRSG TEGADERM 4X4.75 (GAUZE/BANDAGES/DRESSINGS) ×3 IMPLANT
DRSG TELFA 3X8 NADH (GAUZE/BANDAGES/DRESSINGS) ×3 IMPLANT
ELECT REM PT RETURN 9FT ADLT (ELECTROSURGICAL) ×3
ELECTRODE REM PT RTRN 9FT ADLT (ELECTROSURGICAL) ×1 IMPLANT
GAUZE SPONGE 4X4 12PLY STRL LF (GAUZE/BANDAGES/DRESSINGS) IMPLANT
GLOVE BIO SURGEON STRL SZ7.5 (GLOVE) ×3 IMPLANT
GLOVE BIOGEL PI IND STRL 7.5 (GLOVE) IMPLANT
GLOVE BIOGEL PI INDICATOR 7.5 (GLOVE) ×6
GLOVE SURG SS PI 7.5 STRL IVOR (GLOVE) ×2 IMPLANT
GOWN PREVENTION PLUS LG XLONG (DISPOSABLE) ×1 IMPLANT
GOWN STRL REIN XL XLG (GOWN DISPOSABLE) ×1 IMPLANT
GOWN STRL REUS W/TWL XL LVL3 (GOWN DISPOSABLE) ×4 IMPLANT
HOLDER FOLEY CATH W/STRAP (MISCELLANEOUS) ×3 IMPLANT
INTRODUCER GUIDE DILATR SHEATH (SET/KITS/TRAYS/PACK) ×3 IMPLANT
LEAD (Lead) ×3 IMPLANT
NEEDLE FORAMEN 20GA 3.5  9CM (NEEDLE) IMPLANT
NEEDLE FORAMEN 20GA 5  12.5CM (NEEDLE) IMPLANT
NEEDLE HYPO 22GX1.5 SAFETY (NEEDLE) ×3 IMPLANT
PACK BASIN DAY SURGERY FS (CUSTOM PROCEDURE TRAY) ×3 IMPLANT
PAD DRESSING TELFA 3X8 NADH (GAUZE/BANDAGES/DRESSINGS) IMPLANT
PENCIL BUTTON HOLSTER BLD 10FT (ELECTRODE) ×3 IMPLANT
PROGRAMMER ANTENNA EXT (UROLOGICAL SUPPLIES) ×2 IMPLANT
PROGRAMMER STIMUL 2.2X1.1X3.7 (UROLOGICAL SUPPLIES) ×3 IMPLANT
SPONGE GAUZE 4X4 12PLY (GAUZE/BANDAGES/DRESSINGS) ×2 IMPLANT
STAPLER VISISTAT 35W (STAPLE) IMPLANT
STIMULATOR INTERSTIM 2X1.7X.3 (Orthopedic Implant) ×3 IMPLANT
STRIP CLOSURE SKIN 1/2X4 (GAUZE/BANDAGES/DRESSINGS) ×2 IMPLANT
SUCTION FRAZIER TIP 10 FR DISP (SUCTIONS) ×2 IMPLANT
SUT SILK 2 0 (SUTURE) ×3
SUT SILK 2-0 18XBRD TIE 12 (SUTURE) ×1 IMPLANT
SUT VIC AB 3-0 SH 27 (SUTURE) ×6
SUT VIC AB 3-0 SH 27X BRD (SUTURE) ×2 IMPLANT
SUT VICRYL 4-0 PS2 18IN ABS (SUTURE) ×6 IMPLANT
SYR BULB IRRIGATION 50ML (SYRINGE) ×3 IMPLANT
SYR CONTROL 10ML LL (SYRINGE) ×3 IMPLANT
SYRINGE 10CC LL (SYRINGE) ×3 IMPLANT
TOWEL OR 17X24 6PK STRL BLUE (TOWEL DISPOSABLE) ×6 IMPLANT
TRAY DSU PREP LF (CUSTOM PROCEDURE TRAY) ×3 IMPLANT
TUBE CONNECTING 12'X1/4 (SUCTIONS) ×1
TUBE CONNECTING 12X1/4 (SUCTIONS) ×1 IMPLANT
TUBING SUCTION BULK 100 FT (MISCELLANEOUS) IMPLANT
WATER STERILE IRR 500ML POUR (IV SOLUTION) ×3 IMPLANT

## 2013-05-20 NOTE — Anesthesia Preprocedure Evaluation (Signed)
Anesthesia Evaluation  Patient identified by MRN, date of birth, ID band Patient awake    Reviewed: Allergy & Precautions, H&P , NPO status , Patient's Chart, lab work & pertinent test results  Airway Mallampati: II TM Distance: >3 FB Neck ROM: Full    Dental  (+) Poor Dentition   Pulmonary COPDformer smoker,  breath sounds clear to auscultation        Cardiovascular negative cardio ROS  Rhythm:Regular Rate:Normal     Neuro/Psych negative neurological ROS  negative psych ROS   GI/Hepatic negative GI ROS, Neg liver ROS,   Endo/Other  negative endocrine ROS  Renal/GU negative Renal ROS     Musculoskeletal negative musculoskeletal ROS (+)   Abdominal   Peds  Hematology negative hematology ROS (+)   Anesthesia Other Findings   Reproductive/Obstetrics                           Anesthesia Physical Anesthesia Plan  ASA: III  Anesthesia Plan: General   Post-op Pain Management:    Induction: Intravenous  Airway Management Planned: LMA  Additional Equipment:   Intra-op Plan:   Post-operative Plan: Extubation in OR  Informed Consent: I have reviewed the patients History and Physical, chart, labs and discussed the procedure including the risks, benefits and alternatives for the proposed anesthesia with the patient or authorized representative who has indicated his/her understanding and acceptance.   Dental advisory given  Plan Discussed with: CRNA  Anesthesia Plan Comments:         Anesthesia Quick Evaluation

## 2013-05-20 NOTE — Interval H&P Note (Signed)
History and Physical Interval Note:  05/20/2013 10:35 AM  [name]  has presented today for surgery, with the diagnosis of MALFUNCTIONING INTERSTEM  The various methods of treatment have been discussed with the patient and family. After consideration of risks, benefits and other options for treatment, the patient has consented to  Procedure(s): REPLACEMENT INTERSTIM IMPLANT FIRST STAGE (N/A) REPLACEMENT INTERSTIM IMPLANT SECOND STAGE (N/A) as a surgical intervention .  The patient's history has been reviewed, patient examined, no change in status, stable for surgery.  I have reviewed the patient's chart and labs.  Questions were answered to the patient's satisfaction.     Ascher Schroepfer A Sammye Staff

## 2013-05-20 NOTE — Op Note (Signed)
Preoperative diagnosis: Malfunctioning InterStim and refractory urgency incontinence Postoperative diagnosis: Malfunctioning InterStim and refractory urgency incontinence Surgery: Replacement of InterStim stage I and stage II and check of impedance Surgeon: Dr. Lorin PicketScott Khrystina Bonnes  The patient has the above diagnoses and consented the above procedure. Preoperative antibiotics were given. Extra care was taken with patient positioning. Extra care was taken with skin preparation.  I first marked the left upper buttock incision. I instilled approximately 10 cc of a lidocaine epinephrine mixture. I used cutting cautery to dissect through soft tissues and opeedn up the capsule. I opened up the capsule widely and delivered the IPG. I removed the lead from the IPG.  Using direct vision and pulling on the lead and AP fluoroscopy I found the appropriate turn of the lead and made a 2 cm incision. I made incision after 5 cc of lidocaine epinephrine mixture. I dissected sharply and bluntly through soft tissues. I found and delivered the lead from lateral to medial easily. I sharply mobilized the lead. He was very thin. He did not take a lot of mobilization. With lateral fluoroscopy I noted good position with a hemostat and I removed the lead in total  Using AP and lateral fluoroscopy I placed a 3.5 inch foramen needle through the S3 foramina from medial to lateral. It was a bit more medial on purpose than the initial. I spent approximately 20 minutes to find the correct angle and position to have an excellent bellows at power two  Under fluoroscopic guidance I removed the inner needle of the foramen needle and advanced the guide and then the white trocar to the appropriate depth. I removed the inner trocar and advanced the lead to the appropriate depth pulling back the white sheath. This was done a couple of times with and without the stylette and finally without a curved stylette  He had an excellent toe and bellows  response at position 0, 2 and 3. He had minimal response at 1. When I would pull the lead back he had excellent response at 1. A number of x-rays were taken lateral and in the AP view and I did a lot of testing and really felt after many adjustments of a depth that the lead should be kept in the current position. I was very hopeful that response might change postoperatively  I removed the inner wire white trocar under fluoroscopic guidance not changing the lead position. It was retested again. AP and lateral x-rays were taken. The lead with the passing device was passed from medial to lateral. It was inserted into the IPG as per protocol with a screwdriver utilized. It was easily inserted appropriately in the open pseudocapsule with the lead underneath the IPG.  Impedance check was excellent and normal in all 4-lead positions  I close both incisions with 3-0 Vicryl suture. I used subcuticular for the lateral incision and interrupted 4-0 Vicryl for the medial skin incision  Sterile dressing was applied. Hopefully the operation will reach the patient's treatment goal

## 2013-05-20 NOTE — Anesthesia Postprocedure Evaluation (Signed)
Anesthesia Post Note  [name]  Procedure(s) Performed: Procedure(s) (LRB): REPLACEMENT INTERSTIM IMPLANT FIRST STAGE (N/A) REPLACEMENT INTERSTIM IMPLANT SECOND STAGE (N/A)  Anesthesia type: MAC  Patient location: PACU  Post pain: Pain level controlled  Post assessment: Post-op Vital signs reviewed  Last Vitals: BP 117/66  Pulse 73  Temp(Src) 36.2 C (Oral)  Resp 10  Ht 5' 8.5" (1.74 m)  Wt 132 lb (59.875 kg)  BMI 19.78 kg/m2  SpO2 97%  Post vital signs: Reviewed  Level of consciousness: awake  Complications: No apparent anesthesia complications

## 2013-05-20 NOTE — Transfer of Care (Signed)
Immediate Anesthesia Transfer of Care Note  Patient: [name]  Procedure(s) Performed: Procedure(s): REPLACEMENT INTERSTIM IMPLANT FIRST STAGE (N/A) REPLACEMENT INTERSTIM IMPLANT SECOND STAGE (N/A)  Patient Location: PACU  Anesthesia Type:MAC  Level of Consciousness: awake, alert  and oriented  Airway & Oxygen Therapy: Patient Spontanous Breathing  Post-op Assessment: Report given to PACU RN and Post -op Vital signs reviewed and stable  Post vital signs: Reviewed and stable  Complications: No apparent anesthesia complications

## 2013-05-20 NOTE — Discharge Instructions (Signed)
I have reviewed discharge instructions in detail with the patient. They will follow-up with me or their physician as scheduled. My nurse will also be calling the patients as per protocol.   

## 2013-05-21 ENCOUNTER — Encounter (HOSPITAL_BASED_OUTPATIENT_CLINIC_OR_DEPARTMENT_OTHER): Payer: Self-pay | Admitting: Urology

## 2014-08-11 ENCOUNTER — Ambulatory Visit (HOSPITAL_COMMUNITY)
Admission: RE | Admit: 2014-08-11 | Discharge: 2014-08-11 | Disposition: A | Discharge status: Home / Self Care | Payer: Medicaid Other | Source: Ambulatory Visit | Attending: Pulmonary Disease | Admitting: Pulmonary Disease

## 2014-08-11 ENCOUNTER — Other Ambulatory Visit (HOSPITAL_COMMUNITY): Payer: Self-pay | Admitting: Pulmonary Disease

## 2014-08-11 DIAGNOSIS — M25512 Pain in left shoulder: Secondary | ICD-10-CM | POA: Insufficient documentation

## 2014-08-11 DIAGNOSIS — R52 Pain, unspecified: Secondary | ICD-10-CM

## 2016-07-13 ENCOUNTER — Other Ambulatory Visit (HOSPITAL_COMMUNITY): Payer: Self-pay | Admitting: Pulmonary Disease

## 2016-07-13 ENCOUNTER — Ambulatory Visit (HOSPITAL_COMMUNITY)
Admission: RE | Admit: 2016-07-13 | Discharge: 2016-07-13 | Disposition: A | Discharge status: Home / Self Care | Payer: Medicare Other | Source: Ambulatory Visit | Attending: Pulmonary Disease | Admitting: Pulmonary Disease

## 2016-07-13 DIAGNOSIS — J449 Chronic obstructive pulmonary disease, unspecified: Secondary | ICD-10-CM | POA: Insufficient documentation

## 2016-07-13 DIAGNOSIS — F17200 Nicotine dependence, unspecified, uncomplicated: Secondary | ICD-10-CM | POA: Insufficient documentation

## 2016-07-13 DIAGNOSIS — R634 Abnormal weight loss: Secondary | ICD-10-CM

## 2016-07-29 ENCOUNTER — Emergency Department (HOSPITAL_COMMUNITY)
Admission: EM | Admit: 2016-07-29 | Discharge: 2016-07-30 | Disposition: A | Discharge status: Home / Self Care | Payer: Medicare Other | Attending: Emergency Medicine | Admitting: Emergency Medicine

## 2016-07-29 ENCOUNTER — Encounter (HOSPITAL_COMMUNITY): Payer: Self-pay | Admitting: Emergency Medicine

## 2016-07-29 DIAGNOSIS — Z23 Encounter for immunization: Secondary | ICD-10-CM | POA: Insufficient documentation

## 2016-07-29 DIAGNOSIS — Z79899 Other long term (current) drug therapy: Secondary | ICD-10-CM | POA: Diagnosis not present

## 2016-07-29 DIAGNOSIS — Z87891 Personal history of nicotine dependence: Secondary | ICD-10-CM | POA: Diagnosis not present

## 2016-07-29 DIAGNOSIS — J449 Chronic obstructive pulmonary disease, unspecified: Secondary | ICD-10-CM | POA: Diagnosis not present

## 2016-07-29 DIAGNOSIS — Y999 Unspecified external cause status: Secondary | ICD-10-CM | POA: Insufficient documentation

## 2016-07-29 DIAGNOSIS — X500XXA Overexertion from strenuous movement or load, initial encounter: Secondary | ICD-10-CM | POA: Insufficient documentation

## 2016-07-29 DIAGNOSIS — Y9389 Activity, other specified: Secondary | ICD-10-CM | POA: Insufficient documentation

## 2016-07-29 DIAGNOSIS — S51811A Laceration without foreign body of right forearm, initial encounter: Secondary | ICD-10-CM | POA: Diagnosis not present

## 2016-07-29 DIAGNOSIS — Y929 Unspecified place or not applicable: Secondary | ICD-10-CM | POA: Insufficient documentation

## 2016-07-29 DIAGNOSIS — S59911A Unspecified injury of right forearm, initial encounter: Secondary | ICD-10-CM | POA: Diagnosis present

## 2016-07-29 NOTE — ED Triage Notes (Signed)
Reports cutting right arm helping someone move furniture.  Two small lacerations noted in triage.  Wrapped in kling.  Bleeding well controlled in triage.

## 2016-07-30 DIAGNOSIS — S51811A Laceration without foreign body of right forearm, initial encounter: Secondary | ICD-10-CM | POA: Diagnosis not present

## 2016-07-30 MED ORDER — TETANUS-DIPHTH-ACELL PERTUSSIS 5-2.5-18.5 LF-MCG/0.5 IM SUSP
0.5000 mL | Freq: Once | INTRAMUSCULAR | Status: AC
  Administered 2016-07-30: 0.5 mL via INTRAMUSCULAR
  Filled 2016-07-30: qty 0.5

## 2016-07-30 NOTE — ED Notes (Signed)
Pt verbalized understanding discharge instructions and denies any further needs or questions at this time. VS stable, ambulatory and steady gait.   

## 2016-07-30 NOTE — ED Provider Notes (Signed)
[facility]-EMERGENCY DEPT Provider Note   CSN: [id number] Arrival date & time: 07/29/16  2216     History   Chief Complaint Chief Complaint  Patient presents with  . Extremity Laceration    HPI [name] is a 71 y.o. male presents to the ED with 2 small lacerations to the medial right forearm sustained less than 8 hours ago. Patient was lifting a pullout couch when he accidentally cut scraped in the forearm leading to lacerations. He reports minimal pain and controlled bleeding. He is right handed. No blood thinning medications. No numbness, tingling to distal extremity.  HPI  Past Medical History:  Diagnosis Date  . Arthritis    KNEE,  SHOULDERS  . COPD with emphysema (HCC)   . DDD (degenerative disc disease), lumbosacral   . Dyslipidemia   . History of prostate cancer    2008--  S/P  RADICAL RETROPUBIC PROSTATECTOMY  . Urinary incontinence     There are no active problems to display for this patient.   Past Surgical History:  Procedure Laterality Date  . ANTERIOR CERVICAL DECOMP/DISCECTOMY FUSION  07-18-2000   C3 -- C6  . INGUINAL HERNIA REPAIR Bilateral LEFT  12-19-2007/   RIGHT  08-11-2008  . INSERTION MALE SLING /  CYSTOSCOPY  12-01-2008   SUI  . INTERSTIM IMPLANT PLACEMENT  08-08-2010   URGE INCONTINENCE-  . INTERSTIM IMPLANT PLACEMENT N/A 05/20/2013   Procedure: REPLACEMENT INTERSTIM IMPLANT FIRST STAGE;  Surgeon: Martina Sinner, MD;  Location: [facility];  Service: Urology;  Laterality: N/A;  . INTERSTIM IMPLANT PLACEMENT N/A 05/20/2013   Procedure: REPLACEMENT INTERSTIM IMPLANT SECOND STAGE;  Surgeon: Martina Sinner, MD;  Location: [facility];  Service: Urology;  Laterality: N/A;  . KNEE ARTHROSCOPY Left 2008  . ROBOT ASSISTED LAPAROSCOPIC RADICAL PROSTATECTOMY  12-05-2006   W/ BILATERAL PELVIC LYMPH NODE DISSECTION  . WRIST ARTHROSCOPY WITH DEBRIDEMENT Right 05-20-2003   AND OPEN HARDWARE REMOVAL       Home  Medications    Prior to Admission medications   Medication Sig Start Date End Date Taking? Authorizing Provider  cephALEXin (KEFLEX) 250 MG capsule Take 1 capsule (250 mg total) by mouth 3 (three) times daily. 05/20/13   Alfredo Martinez, MD  HYDROcodone-acetaminophen (NORCO) 5-325 MG per tablet Take 1 tablet by mouth every 6 (six) hours as needed for moderate pain. 05/20/13   MacDiarmid, Lorin Picket, MD  potassium chloride SA (K-DUR,KLOR-CON) 20 MEQ tablet Take 20 mEq by mouth every morning.     [provider]  rosuvastatin (CRESTOR) 10 MG tablet Take 10 mg by mouth every morning.    [provider]    Family History No family history on file.  Social History Social History  Substance Use Topics  . Smoking status: Former Smoker    Types: Cigarettes  . Smokeless tobacco: Current User    Types: Chew     Comment: CHEW TOBACCO SINCE AGE 71/ QUIT SMOKING AGE 71  . Alcohol use No     Allergies   Patient has no known allergies.   Review of Systems Review of Systems  Musculoskeletal: Negative for arthralgias.  Skin: Positive for wound.  Neurological: Negative for numbness.     Physical Exam Updated Vital Signs BP (!) 127/91 (BP Location: Left Arm)   Pulse 93   Temp 97.9 F (36.6 C) (Oral)   Resp 18   Ht 5' 8.5" (1.74 m)   Wt 59 kg (130 lb)   SpO2  98%   BMI 19.48 kg/m   Physical Exam  Constitutional: He is oriented to person, place, and time. He appears well-developed and well-nourished. No distress.  NAD.  HENT:  Head: Normocephalic and atraumatic.  Right Ear: External ear normal.  Left Ear: External ear normal.  Nose: Nose normal.      Eyes: Conjunctivae and EOM are normal. No scleral icterus.  Cardiovascular: Normal rate, regular rhythm, normal heart sounds and intact distal pulses.   No murmur heard. Pulmonary/Chest: Effort normal and breath sounds normal. He has no wheezes.  Musculoskeletal: Normal range of motion. He exhibits no deformity.    Full active range of motion of right upper extremity without pain.  Neurological: He is alert and oriented to person, place, and time.  Skin: Skin is warm and dry. Capillary refill takes less than 2 seconds. Laceration noted.  2 straight edged lacerations to medial forearm, both lacerations are approximately 1 cm in length. Bleeding controlled.  Psychiatric: He has a normal mood and affect. His behavior is normal. Judgment and thought content normal.  Nursing note and vitals reviewed.    ED Treatments / Results  Labs (all labs ordered are listed, but only abnormal results are displayed) Labs Reviewed - No data to display  EKG  EKG Interpretation None       Radiology No results found.  Procedures .Marland KitchenLaceration Repair Date/Time: 07/30/2016 12:52 AM Performed by: Liberty Handy Authorized by: Liberty Handy   Consent:    Consent obtained:  Verbal   Consent given by:  Patient   Risks discussed:  Infection, pain and poor cosmetic result   Alternatives discussed:  Observation (suture repair) Anesthesia (see MAR for exact dosages):    Anesthesia method:  None Laceration details:    Location:  Shoulder/arm   Shoulder/arm location:  R lower arm   Length (cm):  1 Repair type:    Repair type:  Simple Pre-procedure details:    Preparation:  Patient was prepped and draped in usual sterile fashion Exploration:    Hemostasis achieved with:  Direct pressure   Wound exploration: entire depth of wound probed and visualized     Contaminated: no   Treatment:    Area cleansed with:  Betadine   Amount of cleaning:  Standard   Irrigation method:  Tap   Visualized foreign bodies/material removed: no   Skin repair:    Repair method:  Tissue adhesive Approximation:    Approximation:  Close   Vermilion border: well-aligned   Post-procedure details:    Dressing:  Open (no dressing)   Patient tolerance of procedure:  Tolerated well, no immediate complications   (including  critical care time)  Medications Ordered in ED Medications  Tdap (BOOSTRIX) injection 0.5 mL (0.5 mLs Intramuscular Given 07/30/16 0034)     Initial Impression / Assessment and Plan / ED Course  I have reviewed the triage vital signs and the nursing notes.  Pertinent labs & imaging results that were available during my care of the patient were reviewed by me and considered in my medical decision making (see chart for details).    Patient is a 71 y.o. yo male that presents with lacerations to right forearm. Both lacerations are < 1cm, straight edges. Tdap booster given. Pressure irrigation performed. Bottom of the wound visualized with bleeding controled, no foreign bodies seen.  No tendon or nerve injury noted. Full ROM of affected extremity. Laceration occurred < 12 hours prior to repair which was well tolerated. Pt has  no co morbidities to effect normal wound healing. Dermabond used to repair laceration with well aligned borders. Dermabond after care instructions given. Pt verbalized understanding and agreeable with plan.    Final Clinical Impressions(s) / ED Diagnoses   Final diagnoses:  Laceration of right forearm, initial encounter    New Prescriptions New Prescriptions   No medications on file     Jerrell MylarGibbons, Dalante Minus J, PA-C 07/30/16 0054    Melene PlanFloyd, Dan, DO 08/02/16 0700

## 2016-07-30 NOTE — Discharge Instructions (Signed)
Your two lacerations were small enough that they were repaired with skin glue (dermabond). Please read attached information on how to care for your laceration after dermabond repair. Monitor for signs of infection including increased redness, swelling, discharge, fever. Follow up with PCP if symptoms worsen or you have concerns. You received a tetanus shot today.  DERMABOND* Topical Skin Adhesive (2-octyl cyanoacrylate) is a sterile, liquid skin adhesive that holds wound edges together. The film will usually remain in place for 5 to 10 days, then naturally fall off your skin. The following will answer some of your questions and provide instructions for proper care for your wound while it is healing:  CHECK WOUND APPEARANCE  Some swelling, redness, and pain are common with all wounds and normally will go away as the wound heals. If swelling, redness, or pain increases or if the wound feels warm to the touch, contact a doctor. Also contact a doctor if the wound edges reopen or separate.  REPLACE BANDAGES  If your wound is bandaged, keep the bandage dry.  Replace the dressing daily until the adhesive film has fallen off or if the bandage should become wet, unless otherwise instructed by your physician.  When changing the dressing, do not place tape directly over the DERMABOND adhesive film, because removing the tape later may also remove the film.  AVOID TOPICAL MEDICATIONS  Do not apply liquid or ointment medications or any other product to your wound while the DERMABOND adhesive film is in place. These may loosen the film before your wound is healed.  KEEP WOUND DRY AND PROTECTED  You may occasionally and briefly wet your wound in the shower or bath. Do not soak or scrub your wound, do not swim, and avoid periods of heavy perspiration until the DERMABOND adhesive has naturally fallen off. After showering or bathing, gently blot your wound dry with a soft towel. If a protective dressing is being used,  apply a fresh, dry bandage, being sure to keep the tape off the DERMABOND adhesive film.  Apply a clean, dry bandage over the wound if necessary to protect it.  Protect your wound from injury until the skin has had sufficient time to heal.  Do not scratch, rub, or pick at the DERMABOND adhesive film. This may loosen the film before your wound is healed.  Protect the wound from prolonged exposure to sunlight or tanning lamps while the film is in place.

## 2016-09-19 ENCOUNTER — Emergency Department (HOSPITAL_COMMUNITY)
Admission: EM | Admit: 2016-09-19 | Discharge: 2016-09-19 | Disposition: A | Discharge status: Home / Self Care | Payer: Medicare Other | Attending: Emergency Medicine | Admitting: Emergency Medicine

## 2016-09-19 ENCOUNTER — Emergency Department (HOSPITAL_COMMUNITY): Payer: Medicare Other

## 2016-09-19 ENCOUNTER — Encounter (HOSPITAL_COMMUNITY): Payer: Self-pay

## 2016-09-19 DIAGNOSIS — Z8546 Personal history of malignant neoplasm of prostate: Secondary | ICD-10-CM | POA: Diagnosis not present

## 2016-09-19 DIAGNOSIS — R55 Syncope and collapse: Secondary | ICD-10-CM | POA: Insufficient documentation

## 2016-09-19 DIAGNOSIS — R4182 Altered mental status, unspecified: Secondary | ICD-10-CM | POA: Diagnosis present

## 2016-09-19 DIAGNOSIS — Z79899 Other long term (current) drug therapy: Secondary | ICD-10-CM | POA: Insufficient documentation

## 2016-09-19 DIAGNOSIS — F17220 Nicotine dependence, chewing tobacco, uncomplicated: Secondary | ICD-10-CM | POA: Insufficient documentation

## 2016-09-19 DIAGNOSIS — J449 Chronic obstructive pulmonary disease, unspecified: Secondary | ICD-10-CM | POA: Insufficient documentation

## 2016-09-19 LAB — COMPREHENSIVE METABOLIC PANEL
ALBUMIN: 3.4 g/dL — AB (ref 3.5–5.0)
ALK PHOS: 69 U/L (ref 38–126)
ALT: 71 U/L — ABNORMAL HIGH (ref 17–63)
AST: 150 U/L — ABNORMAL HIGH (ref 15–41)
Anion gap: 11 (ref 5–15)
BUN: 5 mg/dL — ABNORMAL LOW (ref 6–20)
CALCIUM: 8.9 mg/dL (ref 8.9–10.3)
CHLORIDE: 104 mmol/L (ref 101–111)
CO2: 22 mmol/L (ref 22–32)
Creatinine, Ser: 0.88 mg/dL (ref 0.61–1.24)
GFR calc Af Amer: >60 mL/min (ref >60)
GFR calc non Af Amer: >60 mL/min (ref >60)
Glucose, Bld: 100 mg/dL — ABNORMAL HIGH (ref 65–99)
POTASSIUM: 4.2 mmol/L (ref 3.5–5.1)
Sodium: 137 mmol/L (ref 135–145)
Total Bilirubin: 1.3 mg/dL — ABNORMAL HIGH (ref 0.3–1.2)
Total Protein: 7.1 g/dL (ref 6.5–8.1)

## 2016-09-19 LAB — CBC WITH DIFFERENTIAL/PLATELET
Basophils Absolute: 0 10*3/uL (ref 0.0–0.1)
Basophils Relative: 0 %
EOS ABS: 0 10*3/uL (ref 0.0–0.7)
Eosinophils Relative: 0 %
HCT: 34.1 % — ABNORMAL LOW (ref 39.0–52.0)
HEMOGLOBIN: 11.6 g/dL — AB (ref 13.0–17.0)
LYMPHS PCT: 11 %
Lymphs Abs: 0.7 10*3/uL (ref 0.7–4.0)
MCH: 32 pg (ref 26.0–34.0)
MCHC: 34 g/dL (ref 30.0–36.0)
MCV: 94.2 fL (ref 78.0–100.0)
Monocytes Absolute: 0.6 10*3/uL (ref 0.1–1.0)
Monocytes Relative: 10 %
NEUTROS PCT: 79 %
Neutro Abs: 4.6 10*3/uL (ref 1.7–7.7)
Platelets: 225 10*3/uL (ref 150–400)
RBC: 3.62 MIL/uL — AB (ref 4.22–5.81)
RDW: 13.7 % (ref 11.5–15.5)
WBC: 5.9 10*3/uL (ref 4.0–10.5)

## 2016-09-19 LAB — I-STAT TROPONIN, ED: Troponin i, poc: 0 ng/mL (ref 0.00–0.08)

## 2016-09-19 MED ORDER — SODIUM CHLORIDE 0.9 % IV BOLUS (SEPSIS)
1000.0000 mL | Freq: Once | INTRAVENOUS | Status: AC
  Administered 2016-09-19: 1000 mL via INTRAVENOUS

## 2016-09-19 NOTE — ED Triage Notes (Signed)
Pt BIB [facility] from home where pt had witnessed episode of "drooling" and brief period of unresponsiveness. Pt was with family working on car and had one beer prior to event. Upon EMS arrival pt had similar episode where he was unresponsive looking to the right for a period of 45 seconds. Upon EMS arrival pt found to be hypotensive at 88/54. Given 500 NS IV BP came up to 111/66. Pt A&O X4 denies any pain.

## 2016-09-19 NOTE — ED Notes (Signed)
Patient transported to CT 

## 2016-09-19 NOTE — ED Provider Notes (Signed)
[facility]-EMERGENCY DEPT Provider Note   CSN: [id number] Arrival date & time: 09/19/16  1313     History   Chief Complaint Chief Complaint  Patient presents with  . Altered Mental Status    HPI [name] is a 71 y.o. male.  Patient is a 71 year old male with past medical history of COPD, degenerative disc disease, and prostate cancer. He presents today for evaluation of syncope. He reports working on his car just prior to arrival when he began to feel hot and flushed. He then went and had a beer. Shortly after drinking this, he became unresponsive for several moments. His friend that was with him reports foaming at the mouth. He then called 911 and the patient was transported here. He was initially slightly hypotensive, but that has since normalized. He denies any recent illness. He denies any fevers or chills.   The history is provided by the patient.  Altered Mental Status   This is a new problem. The current episode started less than 1 hour ago. The problem has been resolved. Associated symptoms include unresponsiveness. His past medical history does not include seizures, CVA or TIA.    Past Medical History:  Diagnosis Date  . Arthritis    KNEE,  SHOULDERS  . COPD with emphysema (HCC)   . DDD (degenerative disc disease), lumbosacral   . Dyslipidemia   . History of prostate cancer    2008--  S/P  RADICAL RETROPUBIC PROSTATECTOMY  . Urinary incontinence     There are no active problems to display for this patient.   Past Surgical History:  Procedure Laterality Date  . ANTERIOR CERVICAL DECOMP/DISCECTOMY FUSION  07-18-2000   C3 -- C6  . INGUINAL HERNIA REPAIR Bilateral LEFT  12-19-2007/   RIGHT  08-11-2008  . INSERTION MALE SLING /  CYSTOSCOPY  12-01-2008   SUI  . INTERSTIM IMPLANT PLACEMENT  08-08-2010   URGE INCONTINENCE-  . INTERSTIM IMPLANT PLACEMENT N/A 05/20/2013   Procedure: REPLACEMENT INTERSTIM IMPLANT FIRST STAGE;  Surgeon: Martina Sinner, MD;   Location: [facility];  Service: Urology;  Laterality: N/A;  . INTERSTIM IMPLANT PLACEMENT N/A 05/20/2013   Procedure: REPLACEMENT INTERSTIM IMPLANT SECOND STAGE;  Surgeon: Martina Sinner, MD;  Location: [facility];  Service: Urology;  Laterality: N/A;  . KNEE ARTHROSCOPY Left 2008  . ROBOT ASSISTED LAPAROSCOPIC RADICAL PROSTATECTOMY  12-05-2006   W/ BILATERAL PELVIC LYMPH NODE DISSECTION  . WRIST ARTHROSCOPY WITH DEBRIDEMENT Right 05-20-2003   AND OPEN HARDWARE REMOVAL       Home Medications    Prior to Admission medications   Medication Sig Start Date End Date Taking? Authorizing Provider  cephALEXin (KEFLEX) 250 MG capsule Take 1 capsule (250 mg total) by mouth 3 (three) times daily. 05/20/13   Alfredo Martinez, MD  HYDROcodone-acetaminophen (NORCO) 5-325 MG per tablet Take 1 tablet by mouth every 6 (six) hours as needed for moderate pain. 05/20/13   MacDiarmid, Lorin Picket, MD  potassium chloride SA (K-DUR,KLOR-CON) 20 MEQ tablet Take 20 mEq by mouth every morning.     [provider]  rosuvastatin (CRESTOR) 10 MG tablet Take 10 mg by mouth every morning.    [provider]    Family History No family history on file.  Social History Social History  Substance Use Topics  . Smoking status: Former Smoker    Types: Cigarettes  . Smokeless tobacco: Current User    Types: Chew     Comment: CHEW TOBACCO SINCE  AGE 11/ QUIT SMOKING AGE 64  . Alcohol use Yes     Comment: pt reprots having one 12 oz can today      Allergies   Patient has no known allergies.   Review of Systems Review of Systems  All other systems reviewed and are negative.    Physical Exam Updated Vital Signs BP 109/69 (BP Location: Right Arm)   Pulse 73   Temp 97.7 F (36.5 C) (Oral)   Resp 16   Ht 5' 8.5" (1.74 m)   Wt 61.2 kg (135 lb)   SpO2 97%   BMI 20.23 kg/m   Physical Exam  Constitutional: He is oriented to person, place, and time. He appears  well-developed and well-nourished. No distress.  HENT:  Head: Normocephalic and atraumatic.  Mouth/Throat: Oropharynx is clear and moist.  Eyes: Pupils are equal, round, and reactive to light. EOM are normal.  Neck: Normal range of motion. Neck supple.  Cardiovascular: Normal rate and regular rhythm.  Exam reveals no friction rub.   No murmur heard. Pulmonary/Chest: Effort normal and breath sounds normal. No respiratory distress. He has no wheezes. He has no rales.  Abdominal: Soft. Bowel sounds are normal. He exhibits no distension. There is no tenderness.  Musculoskeletal: Normal range of motion. He exhibits no edema.  Neurological: He is alert and oriented to person, place, and time. No cranial nerve deficit. He exhibits normal muscle tone. Coordination normal.  Skin: Skin is warm and dry. He is not diaphoretic.  Nursing note and vitals reviewed.    ED Treatments / Results  Labs (all labs ordered are listed, but only abnormal results are displayed) Labs Reviewed  CBC WITH DIFFERENTIAL/PLATELET  COMPREHENSIVE METABOLIC PANEL  I-STAT TROPONIN, ED    EKG  EKG Interpretation None       Radiology No results found.  Procedures Procedures (including critical care time)  Medications Ordered in ED Medications  sodium chloride 0.9 % bolus 1,000 mL (not administered)     Initial Impression / Assessment and Plan / ED Course  I have reviewed the triage vital signs and the nursing notes.  Pertinent labs & imaging results that were available during my care of the patient were reviewed by me and considered in my medical decision making (see chart for details).  Patient brought for evaluation of an episode of unresponsiveness that occurred well working on his car. This lasted for several seconds, then resolved. It does not sound as though this was a seizure. His workup reveals a normal head CT and unremarkable laboratory studies. Is feeling better after observation and I believe  is appropriate for discharge.  Final Clinical Impressions(s) / ED Diagnoses   Final diagnoses:  None    New Prescriptions New Prescriptions   No medications on file     Geoffery Lyons, MD 09/19/16 1622

## 2016-09-19 NOTE — Discharge Instructions (Signed)
Drink plenty of fluids and get plenty of rest.  Return to the emergency department if you develop any new and concerning symptoms.

## 2016-09-19 NOTE — ED Notes (Signed)
ED Provider at bedside. 

## 2016-12-04 ENCOUNTER — Other Ambulatory Visit (HOSPITAL_COMMUNITY): Payer: Self-pay | Admitting: Pulmonary Disease

## 2016-12-04 ENCOUNTER — Other Ambulatory Visit: Payer: Self-pay | Admitting: Pulmonary Disease

## 2016-12-04 DIAGNOSIS — E7289 Other specified disorders of amino-acid metabolism: Secondary | ICD-10-CM

## 2016-12-11 ENCOUNTER — Ambulatory Visit (HOSPITAL_COMMUNITY)
Admission: RE | Admit: 2016-12-11 | Discharge: 2016-12-11 | Disposition: A | Discharge status: Home / Self Care | Payer: Medicare Other | Source: Ambulatory Visit | Attending: Pulmonary Disease | Admitting: Pulmonary Disease

## 2016-12-11 DIAGNOSIS — F1010 Alcohol abuse, uncomplicated: Secondary | ICD-10-CM | POA: Insufficient documentation

## 2016-12-11 DIAGNOSIS — E7289 Other specified disorders of amino-acid metabolism: Secondary | ICD-10-CM | POA: Insufficient documentation

## 2017-04-03 ENCOUNTER — Emergency Department (HOSPITAL_COMMUNITY): Payer: Medicare Other

## 2017-04-03 ENCOUNTER — Other Ambulatory Visit: Payer: Self-pay

## 2017-04-03 ENCOUNTER — Emergency Department (HOSPITAL_COMMUNITY)
Admission: EM | Admit: 2017-04-03 | Discharge: 2017-04-03 | Disposition: A | Discharge status: Home / Self Care | Payer: Medicare Other | Attending: Emergency Medicine | Admitting: Emergency Medicine

## 2017-04-03 DIAGNOSIS — Z79899 Other long term (current) drug therapy: Secondary | ICD-10-CM | POA: Insufficient documentation

## 2017-04-03 DIAGNOSIS — F17220 Nicotine dependence, chewing tobacco, uncomplicated: Secondary | ICD-10-CM | POA: Diagnosis not present

## 2017-04-03 DIAGNOSIS — M79601 Pain in right arm: Secondary | ICD-10-CM | POA: Diagnosis present

## 2017-04-03 DIAGNOSIS — W19XXXA Unspecified fall, initial encounter: Secondary | ICD-10-CM

## 2017-04-03 NOTE — ED Triage Notes (Signed)
Pt arrived [facility] from home where he states he fell at hurt is right arm swelling noted to right arm. ETOH on board pt reports a couple beers and a shot of liquor.

## 2017-04-03 NOTE — Discharge Instructions (Signed)
Your x-rays today did not show evidence of fracture.  Your exam was reassuring in regards to sensation, strength, and pulses.  Please follow-up with your primary care physician in several days for reassessment.  Please use the sling and over-the-counter pain medications.  Please be careful not to fall.  If any symptoms change or worsen, please return to the nearest emergency department.

## 2017-04-03 NOTE — Progress Notes (Signed)
Orthopedic Tech Progress Note Patient Details:  Justin LungerSteve W [facility] 11/18/1945 [id number]  Ortho Devices Type of Ortho Device: Shoulder immobilizer Ortho Device/Splint Location: RUE Ortho Device/Splint Interventions: Ordered, Application   Post Interventions Patient Tolerated: Well Instructions Provided: Care of device   [name], [name] 04/03/2017, 8:56 PM

## 2017-04-03 NOTE — ED Provider Notes (Signed)
[facility] EMERGENCY DEPARTMENT Provider Note   CSN: [id number] Arrival date & time: 04/03/17  1729     History   Chief Complaint Chief Complaint  Patient presents with  . Fall  . Arm Pain    HPI [name] is a 72 y.o. male.  The history is provided by the patient and medical records. No language interpreter was used.  Arm Injury   This is a new problem. The current episode started less than 1 hour ago. The problem occurs constantly. The problem has not changed since onset.The pain is present in the right elbow. The quality of the pain is described as aching. The pain is moderate. Pertinent negatives include no numbness, full range of motion and no stiffness. He has tried nothing for the symptoms. The treatment provided no relief. There has been a history of trauma.    Past Medical History:  Diagnosis Date  . Arthritis    KNEE,  SHOULDERS  . COPD with emphysema (HCC)   . DDD (degenerative disc disease), lumbosacral   . Dyslipidemia   . History of prostate cancer    2008--  S/P  RADICAL RETROPUBIC PROSTATECTOMY  . Urinary incontinence     There are no active problems to display for this patient.   Past Surgical History:  Procedure Laterality Date  . ANTERIOR CERVICAL DECOMP/DISCECTOMY FUSION  07-18-2000   C3 -- C6  . INGUINAL HERNIA REPAIR Bilateral LEFT  12-19-2007/   RIGHT  08-11-2008  . INSERTION MALE SLING /  CYSTOSCOPY  12-01-2008   SUI  . INTERSTIM IMPLANT PLACEMENT  08-08-2010   URGE INCONTINENCE-  . INTERSTIM IMPLANT PLACEMENT N/A 05/20/2013   Procedure: REPLACEMENT INTERSTIM IMPLANT FIRST STAGE;  Surgeon: Martina Sinner, MD;  Location: [facility];  Service: Urology;  Laterality: N/A;  . INTERSTIM IMPLANT PLACEMENT N/A 05/20/2013   Procedure: REPLACEMENT INTERSTIM IMPLANT SECOND STAGE;  Surgeon: Martina Sinner, MD;  Location: [facility];  Service: Urology;  Laterality: N/A;  . KNEE ARTHROSCOPY  Left 2008  . ROBOT ASSISTED LAPAROSCOPIC RADICAL PROSTATECTOMY  12-05-2006   W/ BILATERAL PELVIC LYMPH NODE DISSECTION  . WRIST ARTHROSCOPY WITH DEBRIDEMENT Right 05-20-2003   AND OPEN HARDWARE REMOVAL       Home Medications    Prior to Admission medications   Medication Sig Start Date End Date Taking? Authorizing Provider  cephALEXin (KEFLEX) 250 MG capsule Take 1 capsule (250 mg total) by mouth 3 (three) times daily. Patient not taking: Reported on 09/19/2016 05/20/13   Alfredo Martinez, MD  HYDROcodone-acetaminophen (NORCO) 5-325 MG per tablet Take 1 tablet by mouth every 6 (six) hours as needed for moderate pain. Patient not taking: Reported on 09/19/2016 05/20/13   Alfredo Martinez, MD  rosuvastatin (CRESTOR) 10 MG tablet Take 10 mg by mouth every morning.    [provider]    Family History No family history on file.  Social History Social History   Tobacco Use  . Smoking status: Former Smoker    Types: Cigarettes  . Smokeless tobacco: Current User    Types: Chew  . Tobacco comment: CHEW TOBACCO SINCE AGE 65/ QUIT SMOKING AGE 64  Substance Use Topics  . Alcohol use: Yes    Comment: pt reprots having one 12 oz can today   . Drug use: No    Comment: HX MARIJUANA (LAST USED 2009)     Allergies   Patient has no known allergies.   Review of Systems  Review of Systems  Constitutional: Negative for chills, diaphoresis, fatigue and fever.  HENT: Negative for congestion.   Respiratory: Negative for cough, chest tightness, shortness of breath and wheezing.   Gastrointestinal: Negative for abdominal pain, constipation and nausea.  Genitourinary: Negative for dysuria.  Musculoskeletal: Negative for back pain, neck pain, neck stiffness and stiffness.  Skin: Negative for rash and wound.  Neurological: Negative for light-headedness, numbness and headaches.  Psychiatric/Behavioral: Negative for agitation.  All other systems reviewed and are  negative.    Physical Exam Updated Vital Signs BP 119/78 (BP Location: Left Arm)   Pulse 79   Temp 98.3 F (36.8 C) (Oral)   Resp 18   Ht 5' 8.5" (1.74 m)   Wt 70.3 kg (155 lb)   SpO2 97%   BMI 23.22 kg/m   Physical Exam  Constitutional: He is oriented to person, place, and time. He appears well-developed and well-nourished. No distress.  HENT:  Head: Normocephalic and atraumatic.  Nose: Nose normal.  Eyes: Conjunctivae and EOM are normal. Pupils are equal, round, and reactive to light.  Neck: Neck supple.  Cardiovascular: Normal rate and intact distal pulses.  No murmur heard. Pulmonary/Chest: Effort normal and breath sounds normal. He has no rales.  Abdominal: Soft. There is no tenderness.  Musculoskeletal: Normal range of motion. He exhibits edema and tenderness. He exhibits no deformity.       Right elbow: He exhibits swelling. He exhibits normal range of motion, no effusion, no deformity and no laceration. Tenderness found.  Normal sensation, strength, and pulses in RUE.   Neurological: He is alert and oriented to person, place, and time. No sensory deficit. He exhibits normal muscle tone.  Skin: Skin is warm. Capillary refill takes less than 2 seconds. He is not diaphoretic. No erythema. No pallor.  Psychiatric: He has a normal mood and affect.  Nursing note and vitals reviewed.    ED Treatments / Results  Labs (all labs ordered are listed, but only abnormal results are displayed) Labs Reviewed - No data to display  EKG  EKG Interpretation None       Radiology Dg Elbow Complete Right  Result Date: 04/03/2017 CLINICAL DATA:  Patient fell onto carpeted floor while drinking alcohol. Medial and posterior left elbow pain with swelling. EXAM: RIGHT ELBOW - COMPLETE 3+ VIEW COMPARISON:  None. FINDINGS: Soft tissue swelling about the volar and ulnar aspect of the elbow. No joint effusion, dislocation or fracture. Enthesopathy at the triceps insertion of the olecranon  is noted with mild soft tissue swelling overlying it. IMPRESSION: 1. Soft tissue swelling noted of the right elbow along its volar and ulnar aspect. Suspect the presence of a soft tissue hematoma accounting for this appearance. 2. Minimal soft tissue swelling overlying the olecranon spur at the triceps insertion. 3. No acute fracture nor joint dislocation.  No joint effusion. Electronically Signed   By: Tollie Eth M.D.   On: 04/03/2017 18:58    Procedures Procedures (including critical care time)  Medications Ordered in ED Medications - No data to display   Initial Impression / Assessment and Plan / ED Course  I have reviewed the triage vital signs and the nursing notes.  Pertinent labs & imaging results that were available during my care of the patient were reviewed by me and considered in my medical decision making (see chart for details).     DEMONT LINFORD is a 72 y.o. male with a past medical history significant for COPD, arthritis, prostate  cancer and dyslipidemia who presents with right elbow pain after a fall.  Patient reports that he was drinking both beer and liquor today and had a mechanical fall slipping on a rug at home.  He reports falling onto a carpeted floor with his right elbow striking first.  Patient is right-handed.  He reports having pain and some swelling in the elbow prompting him to seek evaluation.  He denies any numbness, tingling, or weakness of the hand.  He denies any pain in his wrist or his shoulder.  He describes his pain is moderate in the elbow.  He has not taken medicine to help with the symptoms.  He denies any other complaints and does not feel intoxicated.  He denies difficulty with ambulation.  Next  On exam, patient had tenderness in the elbow.  Some soft tissue swelling was seen but there was no significant deformity.  Normal sensation, grip strength, and pulses in the radial and ulnar arteries.  Normal capillary refill.  Normal elbow range of motion.   Normal shoulder range of motion.  No laceration seen.  Mild abrasion present.  Exam otherwise unremarkable.  Patient does not appear clinically intoxicated.  X-rays were obtained showing no evidence of fracture.  There was some soft tissue swelling.  Given the patient's pain and tenderness, patient will be placed in a sling for elbow protection.  Based on exam, no evidence of developing compartment syndrome.  Patient instructed to take over-the-counter pain medication and to avoid excessive alcohol intake as to prevent other falls.  Patient will follow up with his PCP in the next week and was informed that he may need repeat imaging to look for occult fracture if his symptoms persist.  Do not feel patient has any other traumatic injuries present.  Patient was able to ambulate without difficulty and I do not feel he is a danger for further falls at this time.  Next  Patient understood plan of care as well as return precautions.  Patient discharged in good condition.   Final Clinical Impressions(s) / ED Diagnoses   Final diagnoses:  Right arm pain  Fall, initial encounter    ED Discharge Orders    None      Clinical Impression: 1. Right arm pain   2. Fall, initial encounter     Disposition: Discharge  Condition: Good  I have discussed the results, Dx and Tx plan with the pt(& family if present). He/she/they expressed understanding and agree(s) with the plan. Discharge instructions discussed at great length. Strict return precautions discussed and pt &/or family have verbalized understanding of the instructions. No further questions at time of discharge.    Discharge Medication List as of 04/03/2017  8:22 PM      Follow Up: Corine Shelter, MD [address] [place] [address] [phone]     [facility] EMERGENCY DEPARTMENT [address] [id number] mc [place] [address] [phone]       Brecklynn Jian, Canary Brim,  MD 04/04/17 [phone]

## 2017-04-03 NOTE — ED Notes (Signed)
ED Provider at bedside. 

## 2017-11-22 ENCOUNTER — Encounter (HOSPITAL_COMMUNITY): Payer: Self-pay

## 2017-11-22 ENCOUNTER — Emergency Department (HOSPITAL_COMMUNITY): Payer: Medicare Other

## 2017-11-22 ENCOUNTER — Other Ambulatory Visit: Payer: Self-pay

## 2017-11-22 ENCOUNTER — Inpatient Hospital Stay (HOSPITAL_COMMUNITY)
Admission: EM | Admit: 2017-11-22 | Discharge: 2017-11-27 | DRG: 199 | Disposition: A | Discharge status: Home / Self Care | Payer: Medicare Other | Attending: General Surgery | Admitting: General Surgery

## 2017-11-22 DIAGNOSIS — Y93E1 Activity, personal bathing and showering: Secondary | ICD-10-CM

## 2017-11-22 DIAGNOSIS — Z87891 Personal history of nicotine dependence: Secondary | ICD-10-CM

## 2017-11-22 DIAGNOSIS — S2242XA Multiple fractures of ribs, left side, initial encounter for closed fracture: Secondary | ICD-10-CM

## 2017-11-22 DIAGNOSIS — F1010 Alcohol abuse, uncomplicated: Secondary | ICD-10-CM | POA: Diagnosis present

## 2017-11-22 DIAGNOSIS — J942 Hemothorax: Secondary | ICD-10-CM

## 2017-11-22 DIAGNOSIS — S270XXA Traumatic pneumothorax, initial encounter: Principal | ICD-10-CM | POA: Diagnosis present

## 2017-11-22 DIAGNOSIS — T797XXA Traumatic subcutaneous emphysema, initial encounter: Secondary | ICD-10-CM | POA: Diagnosis present

## 2017-11-22 DIAGNOSIS — Z7982 Long term (current) use of aspirin: Secondary | ICD-10-CM

## 2017-11-22 DIAGNOSIS — Z8546 Personal history of malignant neoplasm of prostate: Secondary | ICD-10-CM

## 2017-11-22 DIAGNOSIS — S299XXA Unspecified injury of thorax, initial encounter: Secondary | ICD-10-CM

## 2017-11-22 DIAGNOSIS — Z981 Arthrodesis status: Secondary | ICD-10-CM

## 2017-11-22 DIAGNOSIS — S2239XA Fracture of one rib, unspecified side, initial encounter for closed fracture: Secondary | ICD-10-CM | POA: Diagnosis present

## 2017-11-22 DIAGNOSIS — J939 Pneumothorax, unspecified: Secondary | ICD-10-CM | POA: Diagnosis present

## 2017-11-22 DIAGNOSIS — E785 Hyperlipidemia, unspecified: Secondary | ICD-10-CM | POA: Diagnosis present

## 2017-11-22 DIAGNOSIS — Z79899 Other long term (current) drug therapy: Secondary | ICD-10-CM

## 2017-11-22 DIAGNOSIS — E43 Unspecified severe protein-calorie malnutrition: Secondary | ICD-10-CM | POA: Diagnosis present

## 2017-11-22 DIAGNOSIS — W182XXA Fall in (into) shower or empty bathtub, initial encounter: Secondary | ICD-10-CM | POA: Diagnosis present

## 2017-11-22 NOTE — ED Triage Notes (Signed)
Pt here after falling in the bathtub.  Complains of pain to his left ribs and head.  No blood thinners.  Said he had a sharp stabbing pain in his left chest prior to the fall, said he was short of breath after as well.

## 2017-11-23 ENCOUNTER — Encounter (HOSPITAL_COMMUNITY): Payer: Self-pay

## 2017-11-23 ENCOUNTER — Emergency Department (HOSPITAL_COMMUNITY): Payer: Medicare Other

## 2017-11-23 ENCOUNTER — Inpatient Hospital Stay (HOSPITAL_COMMUNITY): Payer: Medicare Other

## 2017-11-23 ENCOUNTER — Observation Stay (HOSPITAL_COMMUNITY): Payer: Medicare Other

## 2017-11-23 DIAGNOSIS — J942 Hemothorax: Secondary | ICD-10-CM | POA: Diagnosis present

## 2017-11-23 DIAGNOSIS — F1010 Alcohol abuse, uncomplicated: Secondary | ICD-10-CM | POA: Diagnosis present

## 2017-11-23 DIAGNOSIS — Z8546 Personal history of malignant neoplasm of prostate: Secondary | ICD-10-CM | POA: Diagnosis not present

## 2017-11-23 DIAGNOSIS — Z7982 Long term (current) use of aspirin: Secondary | ICD-10-CM | POA: Diagnosis not present

## 2017-11-23 DIAGNOSIS — E43 Unspecified severe protein-calorie malnutrition: Secondary | ICD-10-CM | POA: Diagnosis present

## 2017-11-23 DIAGNOSIS — W182XXA Fall in (into) shower or empty bathtub, initial encounter: Secondary | ICD-10-CM | POA: Diagnosis present

## 2017-11-23 DIAGNOSIS — S270XXA Traumatic pneumothorax, initial encounter: Secondary | ICD-10-CM | POA: Diagnosis present

## 2017-11-23 DIAGNOSIS — Z79899 Other long term (current) drug therapy: Secondary | ICD-10-CM | POA: Diagnosis not present

## 2017-11-23 DIAGNOSIS — E785 Hyperlipidemia, unspecified: Secondary | ICD-10-CM | POA: Diagnosis present

## 2017-11-23 DIAGNOSIS — S2242XA Multiple fractures of ribs, left side, initial encounter for closed fracture: Secondary | ICD-10-CM | POA: Diagnosis present

## 2017-11-23 DIAGNOSIS — Z981 Arthrodesis status: Secondary | ICD-10-CM | POA: Diagnosis not present

## 2017-11-23 DIAGNOSIS — Z87891 Personal history of nicotine dependence: Secondary | ICD-10-CM | POA: Diagnosis not present

## 2017-11-23 DIAGNOSIS — J939 Pneumothorax, unspecified: Secondary | ICD-10-CM | POA: Diagnosis present

## 2017-11-23 DIAGNOSIS — T797XXA Traumatic subcutaneous emphysema, initial encounter: Secondary | ICD-10-CM | POA: Diagnosis present

## 2017-11-23 DIAGNOSIS — S2239XA Fracture of one rib, unspecified side, initial encounter for closed fracture: Secondary | ICD-10-CM | POA: Diagnosis present

## 2017-11-23 DIAGNOSIS — Y93E1 Activity, personal bathing and showering: Secondary | ICD-10-CM | POA: Diagnosis not present

## 2017-11-23 LAB — CBC
HEMATOCRIT: 33.3 % — AB (ref 39.0–52.0)
HEMATOCRIT: 34.9 % — AB (ref 39.0–52.0)
HEMOGLOBIN: 10.9 g/dL — AB (ref 13.0–17.0)
HEMOGLOBIN: 11.3 g/dL — AB (ref 13.0–17.0)
MCH: 31.4 pg (ref 26.0–34.0)
MCH: 32.1 pg (ref 26.0–34.0)
MCHC: 32.4 g/dL (ref 30.0–36.0)
MCHC: 32.7 g/dL (ref 30.0–36.0)
MCV: 97.9 fL (ref 80.0–100.0)
MCV: 96.9 fL (ref 80.0–100.0)
NRBC: 0 % (ref 0.0–0.2)
Platelets: 178 10*3/uL (ref 150–400)
Platelets: 219 10*3/uL (ref 150–400)
RBC: 3.4 MIL/uL — ABNORMAL LOW (ref 4.22–5.81)
RBC: 3.6 MIL/uL — ABNORMAL LOW (ref 4.22–5.81)
RDW: 13.8 % (ref 11.5–15.5)
RDW: 13.9 % (ref 11.5–15.5)
WBC: 4.6 10*3/uL (ref 4.0–10.5)
WBC: 4.3 10*3/uL (ref 4.0–10.5)
nRBC: 0 % (ref 0.0–0.2)

## 2017-11-23 LAB — BASIC METABOLIC PANEL
Anion gap: 12 (ref 5–15)
BUN: 6 mg/dL — ABNORMAL LOW (ref 8–23)
CHLORIDE: 105 mmol/L (ref 98–111)
CO2: 19 mmol/L — AB (ref 22–32)
CREATININE: 0.78 mg/dL (ref 0.61–1.24)
Calcium: 8.9 mg/dL (ref 8.9–10.3)
GFR calc non Af Amer: >60 mL/min (ref >60)
Glucose, Bld: 100 mg/dL — ABNORMAL HIGH (ref 70–99)
POTASSIUM: 3.7 mmol/L (ref 3.5–5.1)
SODIUM: 136 mmol/L (ref 135–145)

## 2017-11-23 LAB — I-STAT TROPONIN, ED: Troponin i, poc: 0.01 ng/mL (ref 0.00–0.08)

## 2017-11-23 LAB — MRSA PCR SCREENING: MRSA by PCR: NEGATIVE

## 2017-11-23 MED ORDER — ACETAMINOPHEN 325 MG PO TABS: 650.0000 mg | ORAL_TABLET | ORAL | Status: DC | PRN

## 2017-11-23 MED ORDER — ONDANSETRON HCL 4 MG/2ML IJ SOLN
4.0000 mg | Freq: Four times a day (QID) | INTRAMUSCULAR | Status: DC | PRN
  Filled 2017-11-23: qty 2

## 2017-11-23 MED ORDER — POTASSIUM CHLORIDE CRYS ER 20 MEQ PO TBCR
20.0000 meq | EXTENDED_RELEASE_TABLET | Freq: Every day | ORAL | Status: DC
  Administered 2017-11-23 – 2017-11-27 (×5): 20 meq via ORAL
  Filled 2017-11-23 (×5): qty 1

## 2017-11-23 MED ORDER — ASPIRIN 325 MG PO TABS
325.0000 mg | ORAL_TABLET | Freq: Every day | ORAL | Status: DC
  Administered 2017-11-23 – 2017-11-27 (×5): 325 mg via ORAL
  Filled 2017-11-23 (×5): qty 1

## 2017-11-23 MED ORDER — ENSURE ENLIVE PO LIQD: 237.0000 mL | Freq: Two times a day (BID) | ORAL | Status: DC

## 2017-11-23 MED ORDER — MORPHINE SULFATE (PF) 2 MG/ML IV SOLN
2.0000 mg | INTRAVENOUS | Status: DC | PRN
  Administered 2017-11-23: 2 mg via INTRAVENOUS
  Administered 2017-11-23: 4 mg via INTRAVENOUS
  Administered 2017-11-23 – 2017-11-24 (×2): 2 mg via INTRAVENOUS
  Filled 2017-11-23 (×3): qty 1
  Filled 2017-11-23: qty 2

## 2017-11-23 MED ORDER — OXYCODONE HCL 5 MG PO TABS
5.0000 mg | ORAL_TABLET | ORAL | Status: DC | PRN
  Administered 2017-11-23 – 2017-11-26 (×8): 5 mg via ORAL
  Filled 2017-11-23 (×8): qty 1

## 2017-11-23 MED ORDER — MORPHINE SULFATE (PF) 4 MG/ML IV SOLN
4.0000 mg | Freq: Once | INTRAVENOUS | Status: AC
  Administered 2017-11-23: 4 mg via INTRAVENOUS
  Filled 2017-11-23: qty 1

## 2017-11-23 MED ORDER — ACETAMINOPHEN 325 MG PO TABS
650.0000 mg | ORAL_TABLET | ORAL | Status: DC
  Administered 2017-11-23 – 2017-11-27 (×19): 650 mg via ORAL
  Filled 2017-11-23 (×18): qty 2

## 2017-11-23 MED ORDER — METHOCARBAMOL 500 MG PO TABS
500.0000 mg | ORAL_TABLET | Freq: Three times a day (TID) | ORAL | Status: DC
  Administered 2017-11-23 – 2017-11-27 (×13): 500 mg via ORAL
  Filled 2017-11-23 (×13): qty 1

## 2017-11-23 MED ORDER — ROSUVASTATIN CALCIUM 10 MG PO TABS
10.0000 mg | ORAL_TABLET | Freq: Every morning | ORAL | Status: DC
  Administered 2017-11-23 – 2017-11-27 (×5): 10 mg via ORAL
  Filled 2017-11-23 (×4): qty 1

## 2017-11-23 MED ORDER — DEXTROSE-NACL 5-0.45 % IV SOLN
INTRAVENOUS | Status: DC
  Administered 2017-11-23 (×2): via INTRAVENOUS
  Administered 2017-11-24: 10 mL/h via INTRAVENOUS

## 2017-11-23 MED ORDER — ONDANSETRON HCL 4 MG/2ML IJ SOLN
4.0000 mg | Freq: Once | INTRAMUSCULAR | Status: AC
  Administered 2017-11-23: 4 mg via INTRAVENOUS
  Filled 2017-11-23: qty 2

## 2017-11-23 MED ORDER — ONDANSETRON 4 MG PO TBDP: 4.0000 mg | ORAL_TABLET | Freq: Four times a day (QID) | ORAL | Status: DC | PRN

## 2017-11-23 MED ORDER — IOHEXOL 300 MG/ML  SOLN
75.0000 mL | Freq: Once | INTRAMUSCULAR | Status: AC | PRN
  Administered 2017-11-23: 75 mL via INTRAVENOUS

## 2017-11-23 NOTE — Progress Notes (Signed)
Trauma Service Note  Subjective: Patient recounted the events of the fall.  Immediately came into the ED.    Objective: Vital signs in last 24 hours: Pulse Rate:  [77-91] 90 (10/25 0700) Resp:  [10-25] 14 (10/25 0700) BP: (92-117)/(62-81) 103/67 (10/25 0700) SpO2:  [98 %-100 %] 100 % (10/25 0700)    Intake/Output from previous day: No intake/output data recorded. Intake/Output this shift: No intake/output data recorded.  General: Has significant distress with  Breathing or movement.  Lungs: Crepitance easily palpated on the left.  Decreased breath sounds on the left.  CXR with moderate sized PTX.  Abd: Soft, benign  Extremities: Intact  Neuro: Intact  Lab Results: CBC  Recent Labs    11/22/17 2337 11/23/17 0354  WBC 4.3 4.6  HGB 11.3* 10.9*  HCT 34.9* 33.3*  PLT 219 178   BMET Recent Labs    11/22/17 2337  NA 136  K 3.7  CL 105  CO2 19*  GLUCOSE 100*  BUN 6*  CREATININE 0.78  CALCIUM 8.9   PT/INR No results for input(s): LABPROT, INR in the last 72 hours. ABG No results for input(s): PHART, HCO3 in the last 72 hours.  Invalid input(s): PCO2, PO2  Studies/Results: Dg Chest 2 View  Result Date: 11/22/2017 CLINICAL DATA:  Chest pain after fall in the bathtub EXAM: CHEST - 2 VIEW COMPARISON:  07/13/2016 FINDINGS: Postsurgical changes in the cervical spine. Apical scarring on the right. Small left pneumothorax at the apex and laterally, demonstrating 17 mm pleural-parenchymal separation, estimated at about 10%. Moderate subcutaneous emphysema in the left lateral chest wall. Acute displaced left fifth and sixth rib fractures. Normal heart size. IMPRESSION: 1. Small left apicolateral pneumothorax estimated at about 10%, with acute displaced left fifth and sixth rib fractures. Moderate left subcutaneous emphysema. No midline shift. Critical Value/emergent results were called by telephone at the time of interpretation on 11/22/2017 at 11:54 pm to Dr. Clarene Duke who  took critical results for Dr. Manus Gunning, who verbally acknowledged these results. Electronically Signed   By: Jasmine Pang M.D.   On: 11/22/2017 23:54   Ct Chest W Contrast  Result Date: 11/23/2017 CLINICAL DATA:  Larey Seat in bathtub. Follow up pneumothorax. History of prostate cancer, inguinal hernia repair. EXAM: CT CHEST, ABDOMEN, AND PELVIS WITH CONTRAST TECHNIQUE: Multidetector CT imaging of the chest, abdomen and pelvis was performed following the standard protocol during bolus administration of intravenous contrast. CONTRAST:  75mL OMNIPAQUE IOHEXOL 300 MG/ML  SOLN COMPARISON:  Chest radiograph November 22, 2017 and CT abdomen and pelvis July 21, 2011 FINDINGS: CT CHEST FINDINGS CARDIOVASCULAR: Heart size is normal. No pericardial effusions. Thoracic aorta is normal course and caliber, unremarkable. MEDIASTINUM/NODES: No mediastinal mass. No lymphadenopathy by CT size criteria. Normal appearance of thoracic esophagus though not tailored for evaluation. LUNGS/PLEURA: Tracheobronchial tree is patent, LEFT pneumothorax up to 14 mm from anterior chest wall. Small LEFT hemothorax. LEFT apical atelectasis or scarring. Mild RIGHT apical scarring. RIGHT lung base scarring. No focal consolidation. 2 mm RIGHT upper lobe granuloma. MUSCULOSKELETAL: Subcutaneous gas LEFT hemithorax tracking into the neck. Acute nondisplaced LEFT anterior fifth and sixth rib fractures. ACDF. Moderate degenerative change of the thoracic spine. CT ABDOMEN AND PELVIS FINDINGS HEPATOBILIARY: Diffusely hypodense liver compatible with steatosis. Punctate hepatic granuloma. Normal gallbladder. PANCREAS: Normal. SPLEEN: Normal. ADRENALS/URINARY TRACT: Kidneys are orthotopic, demonstrating symmetric enhancement. No nephrolithiasis, hydronephrosis or solid renal masses. The unopacified ureters are normal in course and caliber. Delayed imaging through the kidneys demonstrates symmetric prompt contrast excretion  within the proximal urinary collecting  system. Urinary bladder is well distended and unremarkable. Normal adrenal glands. STOMACH/BOWEL: The stomach, small and large bowel are normal in course and caliber without inflammatory changes, sensitivity decreased without oral contrast. Mild colonic diverticulosis. VASCULAR/LYMPHATIC: Aortoiliac vessels are normal in course and caliber. Mild calcific atherosclerosis. No lymphadenopathy by CT size criteria. REPRODUCTIVE: Status post prostatectomy. OTHER: No intraperitoneal free fluid or free air. MUSCULOSKELETAL: Subcutaneous gas tracking to LEFT flank. LEFT sacroiliac stimulator battery pack and LEFT gluteal subcutaneous fat. Severe degenerative change lumbar spine. IMPRESSION: CT CHEST: 1. Small LEFT hemopneumothorax.  No mediastinal shift. 2. Acute nondisplaced LEFT fifth and 6 rib fractures. LEFT hemithorax subcutaneous gas tracking to neck and LEFT flank. CT ABDOMEN AND PELVIS: 1. No CT findings of acute trauma. No acute intra-abdominopelvic process. 2. Mild colonic diverticulosis. 3. Hepatic steatosis. Electronically Signed   By: Awilda Metro M.D.   On: 11/23/2017 01:33   Ct Abdomen Pelvis W Contrast  Result Date: 11/23/2017 CLINICAL DATA:  Larey Seat in bathtub. Follow up pneumothorax. History of prostate cancer, inguinal hernia repair. EXAM: CT CHEST, ABDOMEN, AND PELVIS WITH CONTRAST TECHNIQUE: Multidetector CT imaging of the chest, abdomen and pelvis was performed following the standard protocol during bolus administration of intravenous contrast. CONTRAST:  75mL OMNIPAQUE IOHEXOL 300 MG/ML  SOLN COMPARISON:  Chest radiograph November 22, 2017 and CT abdomen and pelvis July 21, 2011 FINDINGS: CT CHEST FINDINGS CARDIOVASCULAR: Heart size is normal. No pericardial effusions. Thoracic aorta is normal course and caliber, unremarkable. MEDIASTINUM/NODES: No mediastinal mass. No lymphadenopathy by CT size criteria. Normal appearance of thoracic esophagus though not tailored for evaluation. LUNGS/PLEURA:  Tracheobronchial tree is patent, LEFT pneumothorax up to 14 mm from anterior chest wall. Small LEFT hemothorax. LEFT apical atelectasis or scarring. Mild RIGHT apical scarring. RIGHT lung base scarring. No focal consolidation. 2 mm RIGHT upper lobe granuloma. MUSCULOSKELETAL: Subcutaneous gas LEFT hemithorax tracking into the neck. Acute nondisplaced LEFT anterior fifth and sixth rib fractures. ACDF. Moderate degenerative change of the thoracic spine. CT ABDOMEN AND PELVIS FINDINGS HEPATOBILIARY: Diffusely hypodense liver compatible with steatosis. Punctate hepatic granuloma. Normal gallbladder. PANCREAS: Normal. SPLEEN: Normal. ADRENALS/URINARY TRACT: Kidneys are orthotopic, demonstrating symmetric enhancement. No nephrolithiasis, hydronephrosis or solid renal masses. The unopacified ureters are normal in course and caliber. Delayed imaging through the kidneys demonstrates symmetric prompt contrast excretion within the proximal urinary collecting system. Urinary bladder is well distended and unremarkable. Normal adrenal glands. STOMACH/BOWEL: The stomach, small and large bowel are normal in course and caliber without inflammatory changes, sensitivity decreased without oral contrast. Mild colonic diverticulosis. VASCULAR/LYMPHATIC: Aortoiliac vessels are normal in course and caliber. Mild calcific atherosclerosis. No lymphadenopathy by CT size criteria. REPRODUCTIVE: Status post prostatectomy. OTHER: No intraperitoneal free fluid or free air. MUSCULOSKELETAL: Subcutaneous gas tracking to LEFT flank. LEFT sacroiliac stimulator battery pack and LEFT gluteal subcutaneous fat. Severe degenerative change lumbar spine. IMPRESSION: CT CHEST: 1. Small LEFT hemopneumothorax.  No mediastinal shift. 2. Acute nondisplaced LEFT fifth and 6 rib fractures. LEFT hemithorax subcutaneous gas tracking to neck and LEFT flank. CT ABDOMEN AND PELVIS: 1. No CT findings of acute trauma. No acute intra-abdominopelvic process. 2. Mild  colonic diverticulosis. 3. Hepatic steatosis. Electronically Signed   By: Awilda Metro M.D.   On: 11/23/2017 01:33    Anti-infectives: Anti-infectives (From admission, onward)   None      Assessment/Plan: s/p  Needs a left chest tube.  Will place left pigtail catheter Admit to SDU/4NP  LOS: 0 days  Marta Lamas. Gae Bon, MD, FACS [phone] Trauma Surgeon 11/23/2017

## 2017-11-23 NOTE — ED Notes (Signed)
Patient transported to CT 

## 2017-11-23 NOTE — ED Provider Notes (Signed)
[facility] EMERGENCY DEPARTMENT Provider Note   CSN: [id number] Arrival date & time: 11/22/17  2250     History   Chief Complaint Chief Complaint  Patient presents with  . Fall  . Chest Pain    HPI OMAR GAYDEN is a 72 y.o. male.  72 year old male with a history of COPD, degenerative disc disease, dyslipidemia presents to the emergency department for evaluation of left-sided chest pain after a fall in the bathtub tonight.  Incident occurred at approximately 2230.  Fall was mechanical in nature.  Reports striking his left chest wall on the tub.  Denies any head trauma or loss of consciousness.  Is not on chronic anticoagulation.  Continues to have constant pain which is worse with deep breathing.  Describes the pain as sharp and stabbing.  Has not taken any medications for pain prior to arrival.  No fevers, syncope, abdominal pain, N/V.  The history is provided by the patient. No language interpreter was used.  Fall  Associated symptoms include chest pain.  Chest Pain      Past Medical History:  Diagnosis Date  . Arthritis    KNEE,  SHOULDERS  . COPD with emphysema (HCC)   . DDD (degenerative disc disease), lumbosacral   . Dyslipidemia   . History of prostate cancer    2008--  S/P  RADICAL RETROPUBIC PROSTATECTOMY  . Urinary incontinence     Patient Active Problem List   Diagnosis Date Noted  . Rib fracture 11/23/2017    Past Surgical History:  Procedure Laterality Date  . ANTERIOR CERVICAL DECOMP/DISCECTOMY FUSION  07-18-2000   C3 -- C6  . INGUINAL HERNIA REPAIR Bilateral LEFT  12-19-2007/   RIGHT  08-11-2008  . INSERTION MALE SLING /  CYSTOSCOPY  12-01-2008   SUI  . INTERSTIM IMPLANT PLACEMENT  08-08-2010   URGE INCONTINENCE-  . INTERSTIM IMPLANT PLACEMENT N/A 05/20/2013   Procedure: REPLACEMENT INTERSTIM IMPLANT FIRST STAGE;  Surgeon: Martina Sinner, MD;  Location: [facility] [place];  Service: Urology;  Laterality: N/A;  .  INTERSTIM IMPLANT PLACEMENT N/A 05/20/2013   Procedure: REPLACEMENT INTERSTIM IMPLANT SECOND STAGE;  Surgeon: Martina Sinner, MD;  Location: [facility] [place];  Service: Urology;  Laterality: N/A;  . KNEE ARTHROSCOPY Left 2008  . ROBOT ASSISTED LAPAROSCOPIC RADICAL PROSTATECTOMY  12-05-2006   W/ BILATERAL PELVIC LYMPH NODE DISSECTION  . WRIST ARTHROSCOPY WITH DEBRIDEMENT Right 05-20-2003   AND OPEN HARDWARE REMOVAL        Home Medications    Prior to Admission medications   Medication Sig Start Date End Date Taking? Authorizing Provider  aspirin 325 MG tablet Take 325 mg by mouth daily.   Yes [provider]  potassium chloride SA (K-DUR,KLOR-CON) 20 MEQ tablet Take 20 mEq by mouth daily.   Yes [provider]  rosuvastatin (CRESTOR) 10 MG tablet Take 10 mg by mouth every morning.   Yes [provider]  cephALEXin (KEFLEX) 250 MG capsule Take 1 capsule (250 mg total) by mouth 3 (three) times daily. Patient not taking: Reported on 09/19/2016 05/20/13   Alfredo Martinez, MD  HYDROcodone-acetaminophen (NORCO) 5-325 MG per tablet Take 1 tablet by mouth every 6 (six) hours as needed for moderate pain. Patient not taking: Reported on 09/19/2016 05/20/13   Alfredo Martinez, MD    Family History History reviewed. No pertinent family history.  Social History Social History   Tobacco Use  . Smoking status: Former Smoker    Types:  Cigarettes  . Smokeless tobacco: Current User    Types: Chew  . Tobacco comment: CHEW TOBACCO SINCE AGE 46/ QUIT SMOKING AGE 54  Substance Use Topics  . Alcohol use: Yes    Comment: pt reprots having one 12 oz can today   . Drug use: No    Comment: HX MARIJUANA (LAST USED 2009)     Allergies   Patient has no known allergies.   Review of Systems Review of Systems  Cardiovascular: Positive for chest pain.  Ten systems reviewed and are negative for acute change, except as noted in the HPI.    Physical  Exam Updated Vital Signs BP 102/67   Pulse 91   Resp (!) 21   SpO2 98%   Physical Exam  Constitutional: He is oriented to person, place, and time. He appears well-developed and well-nourished. No distress.  Appears uncomfortable, but nontoxic.  Patient in no acute distress.  HENT:  Head: Normocephalic and atraumatic.  No evidence of head trauma.  No battle sign or raccoon's eyes.  Eyes: Conjunctivae and EOM are normal. No scleral icterus.  Neck: Normal range of motion.  Cardiovascular: Normal rate, regular rhythm and intact distal pulses.  Pulmonary/Chest: Effort normal. No respiratory distress. He exhibits tenderness and crepitus.  Chest expansion symmetric.  There is tenderness to palpation to the left lateral chest wall.  Associated crepitus.  Lung sounds diminished on the left compared to right.  No rales or rhonchi.  Abdominal: Soft. He exhibits no distension and no mass. There is no tenderness. There is no guarding.  Soft, nontender, nondistended abdomen.  Musculoskeletal: Normal range of motion.  Neurological: He is alert and oriented to person, place, and time. He exhibits normal muscle tone. Coordination normal.  GCS 15.  Moves all extremities spontaneously.  Skin: Skin is warm and dry. No rash noted. He is not diaphoretic. No erythema. No pallor.  Psychiatric: He has a normal mood and affect. His behavior is normal.  Nursing note and vitals reviewed.    ED Treatments / Results  Labs (all labs ordered are listed, but only abnormal results are displayed) Labs Reviewed  BASIC METABOLIC PANEL - Abnormal; Notable for the following components:      Result Value   CO2 19 (*)    Glucose, Bld 100 (*)    BUN 6 (*)    All other components within normal limits  CBC - Abnormal; Notable for the following components:   RBC 3.60 (*)    Hemoglobin 11.3 (*)    HCT 34.9 (*)    All other components within normal limits  CBC  I-STAT TROPONIN, ED    EKG None  Radiology Dg  Chest 2 View  Result Date: 11/22/2017 CLINICAL DATA:  Chest pain after fall in the bathtub EXAM: CHEST - 2 VIEW COMPARISON:  07/13/2016 FINDINGS: Postsurgical changes in the cervical spine. Apical scarring on the right. Small left pneumothorax at the apex and laterally, demonstrating 17 mm pleural-parenchymal separation, estimated at about 10%. Moderate subcutaneous emphysema in the left lateral chest wall. Acute displaced left fifth and sixth rib fractures. Normal heart size. IMPRESSION: 1. Small left apicolateral pneumothorax estimated at about 10%, with acute displaced left fifth and sixth rib fractures. Moderate left subcutaneous emphysema. No midline shift. Critical Value/emergent results were called by telephone at the time of interpretation on 11/22/2017 at 11:54 pm to Dr. Clarene Duke who took critical results for Dr. Manus Gunning, who verbally acknowledged these results. Electronically Signed   By: Selena Batten  Jake Samples M.D.   On: 11/22/2017 23:54   Ct Chest W Contrast  Result Date: 11/23/2017 CLINICAL DATA:  Larey Seat in bathtub. Follow up pneumothorax. History of prostate cancer, inguinal hernia repair. EXAM: CT CHEST, ABDOMEN, AND PELVIS WITH CONTRAST TECHNIQUE: Multidetector CT imaging of the chest, abdomen and pelvis was performed following the standard protocol during bolus administration of intravenous contrast. CONTRAST:  75mL OMNIPAQUE IOHEXOL 300 MG/ML  SOLN COMPARISON:  Chest radiograph November 22, 2017 and CT abdomen and pelvis July 21, 2011 FINDINGS: CT CHEST FINDINGS CARDIOVASCULAR: Heart size is normal. No pericardial effusions. Thoracic aorta is normal course and caliber, unremarkable. MEDIASTINUM/NODES: No mediastinal mass. No lymphadenopathy by CT size criteria. Normal appearance of thoracic esophagus though not tailored for evaluation. LUNGS/PLEURA: Tracheobronchial tree is patent, LEFT pneumothorax up to 14 mm from anterior chest wall. Small LEFT hemothorax. LEFT apical atelectasis or scarring. Mild  RIGHT apical scarring. RIGHT lung base scarring. No focal consolidation. 2 mm RIGHT upper lobe granuloma. MUSCULOSKELETAL: Subcutaneous gas LEFT hemithorax tracking into the neck. Acute nondisplaced LEFT anterior fifth and sixth rib fractures. ACDF. Moderate degenerative change of the thoracic spine. CT ABDOMEN AND PELVIS FINDINGS HEPATOBILIARY: Diffusely hypodense liver compatible with steatosis. Punctate hepatic granuloma. Normal gallbladder. PANCREAS: Normal. SPLEEN: Normal. ADRENALS/URINARY TRACT: Kidneys are orthotopic, demonstrating symmetric enhancement. No nephrolithiasis, hydronephrosis or solid renal masses. The unopacified ureters are normal in course and caliber. Delayed imaging through the kidneys demonstrates symmetric prompt contrast excretion within the proximal urinary collecting system. Urinary bladder is well distended and unremarkable. Normal adrenal glands. STOMACH/BOWEL: The stomach, small and large bowel are normal in course and caliber without inflammatory changes, sensitivity decreased without oral contrast. Mild colonic diverticulosis. VASCULAR/LYMPHATIC: Aortoiliac vessels are normal in course and caliber. Mild calcific atherosclerosis. No lymphadenopathy by CT size criteria. REPRODUCTIVE: Status post prostatectomy. OTHER: No intraperitoneal free fluid or free air. MUSCULOSKELETAL: Subcutaneous gas tracking to LEFT flank. LEFT sacroiliac stimulator battery pack and LEFT gluteal subcutaneous fat. Severe degenerative change lumbar spine. IMPRESSION: CT CHEST: 1. Small LEFT hemopneumothorax.  No mediastinal shift. 2. Acute nondisplaced LEFT fifth and 6 rib fractures. LEFT hemithorax subcutaneous gas tracking to neck and LEFT flank. CT ABDOMEN AND PELVIS: 1. No CT findings of acute trauma. No acute intra-abdominopelvic process. 2. Mild colonic diverticulosis. 3. Hepatic steatosis. Electronically Signed   By: Awilda Metro M.D.   On: 11/23/2017 01:33   Ct Abdomen Pelvis W  Contrast  Result Date: 11/23/2017 CLINICAL DATA:  Larey Seat in bathtub. Follow up pneumothorax. History of prostate cancer, inguinal hernia repair. EXAM: CT CHEST, ABDOMEN, AND PELVIS WITH CONTRAST TECHNIQUE: Multidetector CT imaging of the chest, abdomen and pelvis was performed following the standard protocol during bolus administration of intravenous contrast. CONTRAST:  75mL OMNIPAQUE IOHEXOL 300 MG/ML  SOLN COMPARISON:  Chest radiograph November 22, 2017 and CT abdomen and pelvis July 21, 2011 FINDINGS: CT CHEST FINDINGS CARDIOVASCULAR: Heart size is normal. No pericardial effusions. Thoracic aorta is normal course and caliber, unremarkable. MEDIASTINUM/NODES: No mediastinal mass. No lymphadenopathy by CT size criteria. Normal appearance of thoracic esophagus though not tailored for evaluation. LUNGS/PLEURA: Tracheobronchial tree is patent, LEFT pneumothorax up to 14 mm from anterior chest wall. Small LEFT hemothorax. LEFT apical atelectasis or scarring. Mild RIGHT apical scarring. RIGHT lung base scarring. No focal consolidation. 2 mm RIGHT upper lobe granuloma. MUSCULOSKELETAL: Subcutaneous gas LEFT hemithorax tracking into the neck. Acute nondisplaced LEFT anterior fifth and sixth rib fractures. ACDF. Moderate degenerative change of the thoracic spine. CT ABDOMEN AND PELVIS FINDINGS  HEPATOBILIARY: Diffusely hypodense liver compatible with steatosis. Punctate hepatic granuloma. Normal gallbladder. PANCREAS: Normal. SPLEEN: Normal. ADRENALS/URINARY TRACT: Kidneys are orthotopic, demonstrating symmetric enhancement. No nephrolithiasis, hydronephrosis or solid renal masses. The unopacified ureters are normal in course and caliber. Delayed imaging through the kidneys demonstrates symmetric prompt contrast excretion within the proximal urinary collecting system. Urinary bladder is well distended and unremarkable. Normal adrenal glands. STOMACH/BOWEL: The stomach, small and large bowel are normal in course and caliber  without inflammatory changes, sensitivity decreased without oral contrast. Mild colonic diverticulosis. VASCULAR/LYMPHATIC: Aortoiliac vessels are normal in course and caliber. Mild calcific atherosclerosis. No lymphadenopathy by CT size criteria. REPRODUCTIVE: Status post prostatectomy. OTHER: No intraperitoneal free fluid or free air. MUSCULOSKELETAL: Subcutaneous gas tracking to LEFT flank. LEFT sacroiliac stimulator battery pack and LEFT gluteal subcutaneous fat. Severe degenerative change lumbar spine. IMPRESSION: CT CHEST: 1. Small LEFT hemopneumothorax.  No mediastinal shift. 2. Acute nondisplaced LEFT fifth and 6 rib fractures. LEFT hemithorax subcutaneous gas tracking to neck and LEFT flank. CT ABDOMEN AND PELVIS: 1. No CT findings of acute trauma. No acute intra-abdominopelvic process. 2. Mild colonic diverticulosis. 3. Hepatic steatosis. Electronically Signed   By: Awilda Metro M.D.   On: 11/23/2017 01:33    Procedures Procedures (including critical care time)  Medications Ordered in ED Medications  acetaminophen (TYLENOL) tablet 650 mg (has no administration in time range)  oxyCODONE (Oxy IR/ROXICODONE) immediate release tablet 5 mg (has no administration in time range)  morphine 2 MG/ML injection 2-4 mg (has no administration in time range)  dextrose 5 %-0.45 % sodium chloride infusion (has no administration in time range)  ondansetron (ZOFRAN-ODT) disintegrating tablet 4 mg (has no administration in time range)    Or  ondansetron (ZOFRAN) injection 4 mg (has no administration in time range)  morphine 4 MG/ML injection 4 mg (4 mg Intravenous Given 11/23/17 0036)  ondansetron (ZOFRAN) injection 4 mg (4 mg Intravenous Given 11/23/17 0035)  iohexol (OMNIPAQUE) 300 MG/ML solution 75 mL (75 mLs Intravenous Contrast Given 11/23/17 0101)     CRITICAL CARE Performed by: Antony Madura   Total critical care time: 45 minutes  Critical care time was exclusive of separately billable  procedures and treating other patients.  Critical care was necessary to treat or prevent imminent or life-threatening deterioration.  Critical care was time spent personally by me on the following activities: development of treatment plan with patient and/or surrogate as well as nursing, discussions with consultants, evaluation of patient's response to treatment, examination of patient, obtaining history from patient or surrogate, ordering and performing treatments and interventions, ordering and review of laboratory studies, ordering and review of radiographic studies, pulse oximetry and re-evaluation of patient's condition.   Initial Impression / Assessment and Plan / ED Course  I have reviewed the triage vital signs and the nursing notes.  Pertinent labs & imaging results that were available during my care of the patient were reviewed by me and considered in my medical decision making (see chart for details).     72 year old male presents to the emergency department following a fall at home.  Fall was mechanical.  He had no head trauma or loss of consciousness.  He is not on chronic anticoagulation.  Noted to have left chest wall tenderness and crepitus on exam.  X-ray shows pneumothorax as well as fractures of the fifth and sixth ribs.  Rib fractures confirmed on CT with hemopneumothorax.  No evidence of midline shift.  Plan for admission to trauma surgery for observation  and continued management.  Patient has remained hemodynamically stable, satting well on room air.  Supplemental O2 2L via [place] ordered for comfort PRN.  Vitals:   11/23/17 0030 11/23/17 0115  BP: 104/75 102/67  Pulse: 78 91  Resp: 20 (!) 21  SpO2: 100% 98%    Final Clinical Impressions(s) / ED Diagnoses   Final diagnoses:  Closed fracture of multiple ribs of left side, initial encounter  Hemopneumothorax on left    ED Discharge Orders    None       Antony Madura, PA-C 11/23/17 0153    Ward, Layla Maw,  DO 11/23/17 1610

## 2017-11-23 NOTE — H&P (Signed)
Activation and Reason: consult, fall in bathtub  Primary Survey: airway patent, nonlabored breathing, pulses intact  [name] is an 72 y.o. male.  HPI: 72 yo male was showering when he slipped and fell and landed on his left side. He has pain in his left chest. He denies loss of consciousness. His girlfriend found him and helped him up and brought him to the ER. The pain is worse with movement and worse with breathing.  Past Medical History:  Diagnosis Date  . Arthritis    KNEE,  SHOULDERS  . COPD with emphysema ([place])   . DDD (degenerative disc disease), lumbosacral   . Dyslipidemia   . History of prostate cancer    2008--  S/P  RADICAL RETROPUBIC PROSTATECTOMY  . Urinary incontinence     Past Surgical History:  Procedure Laterality Date  . ANTERIOR CERVICAL DECOMP/DISCECTOMY FUSION  07-18-2000   C3 -- C6  . INGUINAL HERNIA REPAIR Bilateral LEFT  12-19-2007/   RIGHT  08-11-2008  . INSERTION MALE SLING /  CYSTOSCOPY  12-01-2008   SUI  . INTERSTIM IMPLANT PLACEMENT  08-08-2010   URGE INCONTINENCE-  . INTERSTIM IMPLANT PLACEMENT N/A 05/20/2013   Procedure: REPLACEMENT INTERSTIM IMPLANT FIRST STAGE;  Surgeon: Reece Packer, MD;  Location: [facility];  Service: Urology;  Laterality: N/A;  . INTERSTIM IMPLANT PLACEMENT N/A 05/20/2013   Procedure: REPLACEMENT INTERSTIM IMPLANT SECOND STAGE;  Surgeon: Reece Packer, MD;  Location: [facility];  Service: Urology;  Laterality: N/A;  . KNEE ARTHROSCOPY Left 2008  . ROBOT ASSISTED LAPAROSCOPIC RADICAL PROSTATECTOMY  12-05-2006   W/ BILATERAL PELVIC LYMPH NODE DISSECTION  . WRIST ARTHROSCOPY WITH DEBRIDEMENT Right 05-20-2003   AND OPEN HARDWARE REMOVAL    History reviewed. No pertinent family history.  Social History:  reports that he has quit smoking. His smoking use included cigarettes. His smokeless tobacco use includes chew. He reports that he drinks alcohol. He reports that he does  not use drugs.  Allergies: No Known Allergies  Medications: I have reviewed the patient's current medications.  Results for orders placed or performed during the hospital encounter of 11/22/17 (from the past 48 hour(s))  Basic metabolic panel     Status: Abnormal   Collection Time: 11/22/17 11:37 PM  Result Value Ref Range   Sodium 136 135 - 145 mmol/L   Potassium 3.7 3.5 - 5.1 mmol/L   Chloride 105 98 - 111 mmol/L   CO2 19 (L) 22 - 32 mmol/L   Glucose, Bld 100 (H) 70 - 99 mg/dL   BUN 6 (L) 8 - 23 mg/dL   Creatinine, Ser 0.78 0.61 - 1.24 mg/dL   Calcium 8.9 8.9 - 10.3 mg/dL   GFR calc non Af Amer >60 >60 mL/min   GFR calc Af Amer >60 >60 mL/min    Comment: (NOTE) The eGFR has been calculated using the CKD EPI equation. This calculation has not been validated in all clinical situations. eGFR's persistently <60 mL/min signify possible Chronic Kidney Disease.    Anion gap 12 5 - 15    Comment: Performed at [facility] [address], [place] [address]  CBC     Status: Abnormal   Collection Time: 11/22/17 11:37 PM  Result Value Ref Range   WBC 4.3 4.0 - 10.5 K/uL   RBC 3.60 (L) 4.22 - 5.81 MIL/uL   Hemoglobin 11.3 (L) 13.0 - 17.0 g/dL   HCT 34.9 (L) 39.0 -  52.0 %   MCV 96.9 80.0 - 100.0 fL   MCH 31.4 26.0 - 34.0 pg   MCHC 32.4 30.0 - 36.0 g/dL   RDW 13.8 11.5 - 15.5 %   Platelets 219 150 - 400 K/uL   nRBC 0.0 0.0 - 0.2 %    Comment: Performed at [facility] [address], [place], [place] [address]  I-stat troponin, ED     Status: None   Collection Time: 11/22/17 11:49 PM  Result Value Ref Range   Troponin i, poc 0.01 0.00 - 0.08 ng/mL   Comment 3            Comment: Due to the release kinetics of cTnI, a negative result within the first hours of the onset of symptoms does not rule out myocardial infarction with certainty. If myocardial infarction is still suspected, repeat the test at appropriate intervals.     Dg Chest 2 View  Result  Date: 11/22/2017 CLINICAL DATA:  Chest pain after fall in the bathtub EXAM: CHEST - 2 VIEW COMPARISON:  07/13/2016 FINDINGS: Postsurgical changes in the cervical spine. Apical scarring on the right. Small left pneumothorax at the apex and laterally, demonstrating 17 mm pleural-parenchymal separation, estimated at about 10%. Moderate subcutaneous emphysema in the left lateral chest wall. Acute displaced left fifth and sixth rib fractures. Normal heart size. IMPRESSION: 1. Small left apicolateral pneumothorax estimated at about 10%, with acute displaced left fifth and sixth rib fractures. Moderate left subcutaneous emphysema. No midline shift. Critical Value/emergent results were called by telephone at the time of interpretation on 11/22/2017 at 11:54 pm to Dr. Rex Kras who took critical results for Dr. Wyvonnia Dusky, who verbally acknowledged these results. Electronically Signed   By: Donavan Foil M.D.   On: 11/22/2017 23:54    Review of Systems  Constitutional: Negative for chills and fever.  HENT: Negative for hearing loss.   Eyes: Negative for blurred vision and double vision.  Respiratory: Negative for cough and hemoptysis.   Cardiovascular: Positive for chest pain. Negative for palpitations.  Gastrointestinal: Positive for abdominal pain. Negative for nausea and vomiting.  Genitourinary: Negative for dysuria and urgency.  Musculoskeletal: Negative for myalgias and neck pain.  Skin: Negative for itching and rash.  Neurological: Negative for dizziness, tingling and headaches.  Endo/Heme/Allergies: Does not bruise/bleed easily.  Psychiatric/Behavioral: Negative for depression and suicidal ideas.   Blood pressure 104/75, pulse 78, resp. rate 20, SpO2 100 %. Physical Exam  Constitutional: He is oriented to person, place, and time. He appears well-developed and well-nourished.  HENT:  Head: Not microcephalic. Head is without raccoon's eyes, without abrasion and without contusion.  Right Ear: No  drainage or swelling. No foreign bodies.  Left Ear: No drainage or swelling. No foreign bodies.  Nose: No mucosal edema, rhinorrhea or nose lacerations.  Mouth/Throat: Oropharynx is clear and moist and mucous membranes are normal.  Eyes: Pupils are equal, round, and reactive to light. EOM are normal. Right eye exhibits no discharge. Left eye exhibits no discharge.  Neck: Neck supple.  Cardiovascular:  Pulses:      Carotid pulses are 2+ on the right side, and 2+ on the left side.      Radial pulses are 2+ on the right side, and 2+ on the left side.       Dorsalis pedis pulses are 2+ on the right side, and 2+ on the left side.  Respiratory: No apnea. He has no decreased breath sounds. He has no wheezes. He  has no rhonchi. He has no rales.  GI: He exhibits no shifting dullness and no distension. There is tenderness. There is guarding. There is no rigidity, no tenderness at McBurney's point and negative Murphy's sign.  Neurological: He is alert and oriented to person, place, and time. He has normal strength. No cranial nerve deficit or sensory deficit. GCS eye subscore is 4. GCS verbal subscore is 5. GCS motor subscore is 6.  Skin: Skin is warm and dry.  Psychiatric: His speech is normal and behavior is normal. Thought content normal. His mood appears anxious.      Assessment/Plan: 72 yo male with left sided rib fractures and small apical pneumothorax -CT chest abd pelvis to evaluate tenderness -observe in med surg -pain control  Procedures: none  Arta Bruce Alaysha Jefcoat 11/23/2017, 12:58 AM

## 2017-11-23 NOTE — Procedures (Signed)
Chest Tube Insertion Procedure Note  Indications:  Clinically significant Pneumothorax  Pre-operative Diagnosis: Pneumothorax  Post-operative Diagnosis: Pneumothorax  Procedure Details  Informed consent was obtained for the procedure, including sedation.  Risks of lung perforation, hemorrhage, arrhythmia, and adverse drug reaction were discussed.   After sterile skin prep, using standard technique, a 14 French tube was placed in the left anterolateral 6th rib space.  Findings: Air aspirated   Estimated Blood Loss:  Minimal         Specimens:  None              Complications:  None; patient tolerated the procedure well.         Disposition: SDU in stable condition          Condition: stable  Wells Guiles , [facility] 11/23/2017, 8:19 AM Pager: [phone] Mon-Fri 7:00 am-4:30 pm Sat-Sun 7:00 am-11:30 am

## 2017-11-24 ENCOUNTER — Inpatient Hospital Stay (HOSPITAL_COMMUNITY): Payer: Medicare Other

## 2017-11-24 DIAGNOSIS — E43 Unspecified severe protein-calorie malnutrition: Secondary | ICD-10-CM

## 2017-11-24 MED ORDER — THIAMINE HCL 100 MG/ML IJ SOLN: 100.0000 mg | Freq: Every day | INTRAMUSCULAR | Status: DC

## 2017-11-24 MED ORDER — LORAZEPAM 1 MG PO TABS
1.0000 mg | ORAL_TABLET | Freq: Four times a day (QID) | ORAL | Status: DC | PRN
  Administered 2017-11-24 (×2): 1 mg via ORAL
  Filled 2017-11-24: qty 1

## 2017-11-24 MED ORDER — LORAZEPAM 1 MG PO TABS
1.0000 mg | ORAL_TABLET | Freq: Four times a day (QID) | ORAL | Status: DC | PRN
  Filled 2017-11-24: qty 1

## 2017-11-24 MED ORDER — VITAMIN B-1 100 MG PO TABS
100.0000 mg | ORAL_TABLET | Freq: Every day | ORAL | Status: DC
  Administered 2017-11-25 – 2017-11-26 (×2): 100 mg via ORAL
  Filled 2017-11-24 (×2): qty 1

## 2017-11-24 MED ORDER — ADULT MULTIVITAMIN W/MINERALS CH
1.0000 | ORAL_TABLET | Freq: Every day | ORAL | Status: DC
  Administered 2017-11-25 – 2017-11-26 (×2): 1 via ORAL
  Filled 2017-11-24 (×2): qty 1

## 2017-11-24 MED ORDER — ENSURE ENLIVE PO LIQD
237.0000 mL | Freq: Three times a day (TID) | ORAL | Status: DC
  Administered 2017-11-24 – 2017-11-27 (×9): 237 mL via ORAL

## 2017-11-24 MED ORDER — ADULT MULTIVITAMIN W/MINERALS CH
1.0000 | ORAL_TABLET | Freq: Every day | ORAL | Status: DC
  Administered 2017-11-24: 1 via ORAL
  Filled 2017-11-24: qty 1

## 2017-11-24 MED ORDER — FOLIC ACID 1 MG PO TABS
1.0000 mg | ORAL_TABLET | Freq: Every day | ORAL | Status: DC
  Administered 2017-11-25 – 2017-11-27 (×2): 1 mg via ORAL
  Filled 2017-11-24 (×3): qty 1

## 2017-11-24 MED ORDER — VITAMIN B-1 100 MG PO TABS
100.0000 mg | ORAL_TABLET | Freq: Every day | ORAL | Status: DC
  Administered 2017-11-24: 100 mg via ORAL
  Filled 2017-11-24: qty 1

## 2017-11-24 MED ORDER — FOLIC ACID 1 MG PO TABS
1.0000 mg | ORAL_TABLET | Freq: Every day | ORAL | Status: DC
  Administered 2017-11-24 – 2017-11-26 (×2): 1 mg via ORAL
  Filled 2017-11-24 (×2): qty 1

## 2017-11-24 MED ORDER — LORAZEPAM 2 MG/ML IJ SOLN: 1.0000 mg | Freq: Four times a day (QID) | INTRAMUSCULAR | Status: DC | PRN

## 2017-11-24 NOTE — Progress Notes (Signed)
Trauma Service Note  Subjective: Patient drinks a lot according to his friend.  Placed on the CIWA protocol  Objective: Vital signs in last 24 hours: Temp:  [97.8 F (36.6 C)-98.8 F (37.1 C)] 98.8 F (37.1 C) (10/26 0800) Pulse Rate:  [77-103] 77 (10/26 0600) Resp:  [10-23] 19 (10/26 0600) BP: (117-139)/(75-90) 134/84 (10/26 0600) SpO2:  [94 %-100 %] 97 % (10/26 0600) Weight:  [52 kg] 52 kg (10/25 1500) Last BM Date: (pta)  Intake/Output from previous day: 10/25 0701 - 10/26 0700 In: 1411.2 [I.V.:1411.2] Out: 2270 [Urine:2255; Chest Tube:15] Intake/Output this shift: Total I/O In: -  Out: 400 [Urine:400]  General: No distress.  Does not seem to be in DTs currently.  Lungs: Still with chest wall crepitance.  Apical PTX on suction.  I increased the suction to 40.  Repeat CXR tomorrow.  Abd: Benign  Extremities: No changes  Neuro: INtact  Lab Results: CBC  Recent Labs    11/22/17 2337 11/23/17 0354  WBC 4.3 4.6  HGB 11.3* 10.9*  HCT 34.9* 33.3*  PLT 219 178   BMET Recent Labs    11/22/17 2337  NA 136  K 3.7  CL 105  CO2 19*  GLUCOSE 100*  BUN 6*  CREATININE 0.78  CALCIUM 8.9   PT/INR No results for input(s): LABPROT, INR in the last 72 hours. ABG No results for input(s): PHART, HCO3 in the last 72 hours.  Invalid input(s): PCO2, PO2  Studies/Results: Dg Chest 2 View  Result Date: 11/22/2017 CLINICAL DATA:  Chest pain after fall in the bathtub EXAM: CHEST - 2 VIEW COMPARISON:  07/13/2016 FINDINGS: Postsurgical changes in the cervical spine. Apical scarring on the right. Small left pneumothorax at the apex and laterally, demonstrating 17 mm pleural-parenchymal separation, estimated at about 10%. Moderate subcutaneous emphysema in the left lateral chest wall. Acute displaced left fifth and sixth rib fractures. Normal heart size. IMPRESSION: 1. Small left apicolateral pneumothorax estimated at about 10%, with acute displaced left fifth and sixth rib  fractures. Moderate left subcutaneous emphysema. No midline shift. Critical Value/emergent results were called by telephone at the time of interpretation on 11/22/2017 at 11:54 pm to Dr. Clarene Duke who took critical results for Dr. Manus Gunning, who verbally acknowledged these results. Electronically Signed   By: Jasmine Pang M.D.   On: 11/22/2017 23:54   Ct Chest W Contrast  Result Date: 11/23/2017 CLINICAL DATA:  Larey Seat in bathtub. Follow up pneumothorax. History of prostate cancer, inguinal hernia repair. EXAM: CT CHEST, ABDOMEN, AND PELVIS WITH CONTRAST TECHNIQUE: Multidetector CT imaging of the chest, abdomen and pelvis was performed following the standard protocol during bolus administration of intravenous contrast. CONTRAST:  75mL OMNIPAQUE IOHEXOL 300 MG/ML  SOLN COMPARISON:  Chest radiograph November 22, 2017 and CT abdomen and pelvis July 21, 2011 FINDINGS: CT CHEST FINDINGS CARDIOVASCULAR: Heart size is normal. No pericardial effusions. Thoracic aorta is normal course and caliber, unremarkable. MEDIASTINUM/NODES: No mediastinal mass. No lymphadenopathy by CT size criteria. Normal appearance of thoracic esophagus though not tailored for evaluation. LUNGS/PLEURA: Tracheobronchial tree is patent, LEFT pneumothorax up to 14 mm from anterior chest wall. Small LEFT hemothorax. LEFT apical atelectasis or scarring. Mild RIGHT apical scarring. RIGHT lung base scarring. No focal consolidation. 2 mm RIGHT upper lobe granuloma. MUSCULOSKELETAL: Subcutaneous gas LEFT hemithorax tracking into the neck. Acute nondisplaced LEFT anterior fifth and sixth rib fractures. ACDF. Moderate degenerative change of the thoracic spine. CT ABDOMEN AND PELVIS FINDINGS HEPATOBILIARY: Diffusely hypodense liver compatible with steatosis. Punctate  hepatic granuloma. Normal gallbladder. PANCREAS: Normal. SPLEEN: Normal. ADRENALS/URINARY TRACT: Kidneys are orthotopic, demonstrating symmetric enhancement. No nephrolithiasis, hydronephrosis or  solid renal masses. The unopacified ureters are normal in course and caliber. Delayed imaging through the kidneys demonstrates symmetric prompt contrast excretion within the proximal urinary collecting system. Urinary bladder is well distended and unremarkable. Normal adrenal glands. STOMACH/BOWEL: The stomach, small and large bowel are normal in course and caliber without inflammatory changes, sensitivity decreased without oral contrast. Mild colonic diverticulosis. VASCULAR/LYMPHATIC: Aortoiliac vessels are normal in course and caliber. Mild calcific atherosclerosis. No lymphadenopathy by CT size criteria. REPRODUCTIVE: Status post prostatectomy. OTHER: No intraperitoneal free fluid or free air. MUSCULOSKELETAL: Subcutaneous gas tracking to LEFT flank. LEFT sacroiliac stimulator battery pack and LEFT gluteal subcutaneous fat. Severe degenerative change lumbar spine. IMPRESSION: CT CHEST: 1. Small LEFT hemopneumothorax.  No mediastinal shift. 2. Acute nondisplaced LEFT fifth and 6 rib fractures. LEFT hemithorax subcutaneous gas tracking to neck and LEFT flank. CT ABDOMEN AND PELVIS: 1. No CT findings of acute trauma. No acute intra-abdominopelvic process. 2. Mild colonic diverticulosis. 3. Hepatic steatosis. Electronically Signed   By: Awilda Metro M.D.   On: 11/23/2017 01:33   Ct Abdomen Pelvis W Contrast  Result Date: 11/23/2017 CLINICAL DATA:  Larey Seat in bathtub. Follow up pneumothorax. History of prostate cancer, inguinal hernia repair. EXAM: CT CHEST, ABDOMEN, AND PELVIS WITH CONTRAST TECHNIQUE: Multidetector CT imaging of the chest, abdomen and pelvis was performed following the standard protocol during bolus administration of intravenous contrast. CONTRAST:  75mL OMNIPAQUE IOHEXOL 300 MG/ML  SOLN COMPARISON:  Chest radiograph November 22, 2017 and CT abdomen and pelvis July 21, 2011 FINDINGS: CT CHEST FINDINGS CARDIOVASCULAR: Heart size is normal. No pericardial effusions. Thoracic aorta is normal  course and caliber, unremarkable. MEDIASTINUM/NODES: No mediastinal mass. No lymphadenopathy by CT size criteria. Normal appearance of thoracic esophagus though not tailored for evaluation. LUNGS/PLEURA: Tracheobronchial tree is patent, LEFT pneumothorax up to 14 mm from anterior chest wall. Small LEFT hemothorax. LEFT apical atelectasis or scarring. Mild RIGHT apical scarring. RIGHT lung base scarring. No focal consolidation. 2 mm RIGHT upper lobe granuloma. MUSCULOSKELETAL: Subcutaneous gas LEFT hemithorax tracking into the neck. Acute nondisplaced LEFT anterior fifth and sixth rib fractures. ACDF. Moderate degenerative change of the thoracic spine. CT ABDOMEN AND PELVIS FINDINGS HEPATOBILIARY: Diffusely hypodense liver compatible with steatosis. Punctate hepatic granuloma. Normal gallbladder. PANCREAS: Normal. SPLEEN: Normal. ADRENALS/URINARY TRACT: Kidneys are orthotopic, demonstrating symmetric enhancement. No nephrolithiasis, hydronephrosis or solid renal masses. The unopacified ureters are normal in course and caliber. Delayed imaging through the kidneys demonstrates symmetric prompt contrast excretion within the proximal urinary collecting system. Urinary bladder is well distended and unremarkable. Normal adrenal glands. STOMACH/BOWEL: The stomach, small and large bowel are normal in course and caliber without inflammatory changes, sensitivity decreased without oral contrast. Mild colonic diverticulosis. VASCULAR/LYMPHATIC: Aortoiliac vessels are normal in course and caliber. Mild calcific atherosclerosis. No lymphadenopathy by CT size criteria. REPRODUCTIVE: Status post prostatectomy. OTHER: No intraperitoneal free fluid or free air. MUSCULOSKELETAL: Subcutaneous gas tracking to LEFT flank. LEFT sacroiliac stimulator battery pack and LEFT gluteal subcutaneous fat. Severe degenerative change lumbar spine. IMPRESSION: CT CHEST: 1. Small LEFT hemopneumothorax.  No mediastinal shift. 2. Acute nondisplaced LEFT  fifth and 6 rib fractures. LEFT hemithorax subcutaneous gas tracking to neck and LEFT flank. CT ABDOMEN AND PELVIS: 1. No CT findings of acute trauma. No acute intra-abdominopelvic process. 2. Mild colonic diverticulosis. 3. Hepatic steatosis. Electronically Signed   By: Michel Santee.D.  On: 11/23/2017 01:33   Dg Chest Port 1 View  Result Date: 11/24/2017 CLINICAL DATA:  Left-sided pneumothorax.  Chest tube. EXAM: PORTABLE CHEST 1 VIEW COMPARISON:  November 23, 2017 FINDINGS: A left-sided chest tube is in good position. Subcutaneous air remains over the left chest. I suspect a tiny left apical pneumothorax, unchanged. Mild atelectasis and tiny effusion on the left. No other interval changes. IMPRESSION: 1. Stable left chest tube. Stable air in the left chest wall. Probable tiny left apical pneumothorax. No other change. Electronically Signed   By: Gerome Sam III M.D   On: 11/24/2017 08:59   Dg Chest Portable 1 View  Result Date: 11/23/2017 CLINICAL DATA:  Chest tube insertion.  Left pneumothorax. EXAM: PORTABLE CHEST 1 VIEW COMPARISON:  11/23/2017 FINDINGS: Pigtail drain has been placed in the left chest. Chest tube is in the medial left chest. Left pneumothorax is decreased in size but suspect a small residual apical component. Extensive subcutaneous gas in left chest is again noted. Left basilar chest densities are most compatible with atelectasis. The trachea is midline. Heart size is within normal limits. Right lung remains clear with right apical scarring. IMPRESSION: Placement of left chest tube. Question small left apical pneumothorax but difficult to evaluate due to overlying subcutaneous gas. No large pneumothorax. Volume loss at the left lung base. Electronically Signed   By: Richarda Overlie M.D.   On: 11/23/2017 08:45   Dg Chest Port 1 View  Result Date: 11/23/2017 CLINICAL DATA:  Fall in bathtub.  Pain left ribs and head. EXAM: PORTABLE CHEST 1 VIEW COMPARISON:  CT 11/23/2017.   Chest x-ray 11/22/2017. FINDINGS: Mediastinum and hilar structures normal. Heart size normal. Mild left base subsegmental atelectasis. Left-sided mild-to-moderate pneumothorax. No evidence of displaced rib fracture. Prior cervical spine fusion. IMPRESSION: Left-sided mild-to-moderate pneumothorax. No displaced rib fractures identified. Critical Value/emergent results were called by telephone at the time of interpretation on 11/23/2017 at 7:33 am to nurse Seward Grater, who verbally acknowledged the results. Electronically Signed   By: Maisie Fus  Register   On: 11/23/2017 07:35    Anti-infectives: Anti-infectives (From admission, onward)   None      Assessment/Plan: s/p  Decrease IVFs  Increase CT suction to -40  LOS: 1 day   Marta Lamas. Gae Bon, MD, FACS [phone] Trauma Surgeon 11/24/2017

## 2017-11-24 NOTE — Progress Notes (Signed)
Initial Nutrition Assessment  DOCUMENTATION CODES:   Underweight, Severe malnutrition in context of social or environmental circumstances  INTERVENTION:    Ensure Enlive po TID, each supplement provides 350 kcal and 20 grams of protein  Provide MVI daily  Modify diet for soft meats  -Recommend checking phosphorus and magnesium as pt is at risk for refeeding -Recommend supplementing thiamine and folic acid due to alcohol abuse  NUTRITION DIAGNOSIS:   Severe Malnutrition related to social / environmental circumstances(alcohol abuse) as evidenced by energy intake < or equal to 50% for > or equal to 1 month, percent weight loss, severe fat depletion, severe muscle depletion.  GOAL:   Patient will meet greater than or equal to 90% of their needs  MONITOR:   PO intake, Supplement acceptance, Labs, I & O's, Weight trends  REASON FOR ASSESSMENT:   Malnutrition Screening Tool    ASSESSMENT:   Patient with PMH significant for COPD, dyslipidemia, and prostate cancer s/p prostatectomy. Presents this admission after a mechanical fall resulting in left sided rib fractures and left pneumothorax.    Pt denies having a loss in appetite PTA (girlfriend at bedside disagrees). Pt endorses eating two small meals daily that consist of liver pudding sandwiches and meat with vegetables. Pt reports drinking a pint of whiskey with his friends daily but is unsure of the exact amount he consumes. He denies alcohol affects his appetite. Discussed the effect alcohol has on nutiriton status and importance of protein intake for preservation of lean body mass. Pt eating eggs with bacon at beside. States he forgot his top denture at home and would like for meats to be soft on his tray. RD to modify.   Pt endorses a UBW of 145 lb and a recent wt loss of 40 lb. Records indicate pt weighed 154 lb on 04/03/17 and 115 lb this admission (25.3% wt loss in 7 months, significant for time frame). Nutrition-Focused  physical exam completed.   Left chest tube: 15 ml x 24 hrs  Medications reviewed and include: D5 @ 50 ml/hr Labs reviewed.   NUTRITION - FOCUSED PHYSICAL EXAM:    Most Recent Value  Orbital Region  Moderate depletion  Upper Arm Region  Severe depletion  Thoracic and Lumbar Region  Unable to assess  Buccal Region  Severe depletion  Temple Region  Severe depletion  Clavicle Bone Region  Severe depletion  Clavicle and Acromion Bone Region  Severe depletion  Scapular Bone Region  Unable to assess  Dorsal Hand  Severe depletion  Patellar Region  Severe depletion  Anterior Thigh Region  Severe depletion  Posterior Calf Region  Severe depletion  Edema (RD Assessment)  None     Diet Order:   Diet Order            Diet Carb Modified Room service appropriate? Yes; Fluid consistency: Thin  Diet effective now              EDUCATION NEEDS:   Education needs have been addressed  Skin:  Skin Assessment: Reviewed RN Assessment  Last BM:  PTA  Height:   Ht Readings from Last 1 Encounters:  11/23/17 5\' 8"  (1.727 m)    Weight:   Wt Readings from Last 1 Encounters:  11/23/17 52 kg    Ideal Body Weight:  70 kg  BMI:  Body mass index is 17.43 kg/m.  Estimated Nutritional Needs:   Kcal:  1650-1850 kcal  Protein:  80-95 grams  Fluid:  1.6 L/day  Terrisha Lopata  Oswaldo Milian RD, [facility] Clinical Nutrition Pager # [phone]

## 2017-11-24 NOTE — Evaluation (Signed)
Physical Therapy Evaluation Patient Details Name: GRIER VU MRN: [id number] DOB: 20-Oct-1945 Today's Date: 11/24/2017   History of Present Illness  pt is a 72 y/o male with h/o COPD with emphazema, admitted after fall in the shower with instant L flank pain.  Imaging showed L small hemopnemothorax and nondisplaced 5th and 6th ribs on the left.  Chest tube placed.  Clinical Impression  Pt is at or close to baseline functioning and should be safe at home with limited assist as rib pain subsides. There are no further acute PT needs.  Will sign off at this time.     Follow Up Recommendations No PT follow up    Equipment Recommendations  None recommended by PT    Recommendations for Other Services       Precautions / Restrictions Precautions Precautions: None      Mobility  Bed Mobility               General bed mobility comments: NT oob on arrival.  pt stated this last time up he got up without assistby rollling  Transfers Overall transfer level: Needs assistance   Transfers: Sit to/from Stand Sit to Stand: Modified independent (Device/Increase time)            Ambulation/Gait Ambulation/Gait assistance: Supervision Gait Distance (Feet): 250 Feet Assistive device: None Gait Pattern/deviations: Step-through pattern Gait velocity: moderate Gait velocity interpretation: 1.31 - 2.62 ft/sec, indicative of limited community ambulator General Gait Details: generally steady, carrying his folley, scanning around. 1-2 mild deviations without LOB.  Stairs Stairs: Yes Stairs assistance: Supervision Stair Management: No rails;One rail Right;Alternating pattern;Forwards Number of Stairs: 3 General stair comments: Done without rails, but definitely safe with rail  Wheelchair Mobility    Modified Rankin (Stroke Patients Only)       Balance Overall balance assessment: No apparent balance deficits (not formally assessed)                                            Pertinent Vitals/Pain Pain Assessment: Faces Faces Pain Scale: Hurts little more Pain Location: l flank Pain Descriptors / Indicators: Sore;Grimacing Pain Intervention(s): Monitored during session    Home Living Family/patient expects to be discharged to:: Private residence   Available Help at Discharge: Available PRN/intermittently Type of Home: Apartment Home Access: Elevator     Home Layout: One level Home Equipment: None      Prior Function Level of Independence: Independent               Hand Dominance        Extremity/Trunk Assessment   Upper Extremity Assessment Upper Extremity Assessment: Overall WFL for tasks assessed    Lower Extremity Assessment Lower Extremity Assessment: Overall WFL for tasks assessed       Communication   Communication: No difficulties  Cognition Arousal/Alertness: Awake/alert Behavior During Therapy: WFL for tasks assessed/performed Overall Cognitive Status: Within Functional Limits for tasks assessed                                        General Comments General comments (skin integrity, edema, etc.): vss, sats maintained in med 90's on RA    Exercises     Assessment/Plan    PT Assessment Patent does not need any further PT services  PT Problem List         PT Treatment Interventions      PT Goals (Current goals can be found in the Care Plan section)  Acute Rehab PT Goals PT Goal Formulation: All assessment and education complete, DC therapy    Frequency     Barriers to discharge        Co-evaluation               AM-PAC PT "6 Clicks" Daily Activity  Outcome Measure Difficulty turning over in bed (including adjusting bedclothes, sheets and blankets)?: A Little Difficulty moving from lying on back to sitting on the side of the bed? : A Little Difficulty sitting down on and standing up from a chair with arms (e.g., wheelchair, bedside commode, etc,.)?:  None Help needed moving to and from a bed to chair (including a wheelchair)?: None Help needed walking in hospital room?: A Little Help needed climbing 3-5 steps with a railing? : A Little 6 Click Score: 20    End of Session Equipment Utilized During Treatment: Other (comment)(CT cannister) Activity Tolerance: Patient tolerated treatment well Patient left: in chair;with call bell/phone within reach;with chair alarm set Nurse Communication: Mobility status PT Visit Diagnosis: Difficulty in walking, not elsewhere classified (R26.2)    Time: 4098-1191 PT Time Calculation (min) (ACUTE ONLY): 20 min   Charges:   PT Evaluation $PT Eval Low Complexity: 1 Low          11/24/2017  [place] Bing, PT Acute Rehabilitation Services [phone]  (pager) [phone]  (office)  Eliseo Gum [employer] 11/24/2017, 4:33 PM

## 2017-11-25 ENCOUNTER — Inpatient Hospital Stay (HOSPITAL_COMMUNITY): Payer: Medicare Other

## 2017-11-25 MED ORDER — DOCUSATE SODIUM 100 MG PO CAPS
100.0000 mg | ORAL_CAPSULE | Freq: Two times a day (BID) | ORAL | Status: DC
  Administered 2017-11-25 – 2017-11-27 (×5): 100 mg via ORAL
  Filled 2017-11-25 (×4): qty 1

## 2017-11-25 MED ORDER — ENOXAPARIN SODIUM 40 MG/0.4ML ~~LOC~~ SOLN
40.0000 mg | SUBCUTANEOUS | Status: DC
  Administered 2017-11-25 – 2017-11-27 (×3): 40 mg via SUBCUTANEOUS
  Filled 2017-11-25 (×3): qty 0.4

## 2017-11-25 NOTE — Progress Notes (Signed)
   Subjective/Chief Complaint: Family at [facility]. A little difficulty swallowing pills - chronic prob O/w no c/o  Objective: Vital signs in last 24 hours: Temp:  [98.3 F (36.8 C)-98.9 F (37.2 C)] 98.9 F (37.2 C) (10/27 0425) Pulse Rate:  [85-102] 97 (10/27 1000) Resp:  [12-21] 21 (10/27 1000) BP: (120-141)/(79-102) 141/102 (10/27 1000) SpO2:  [96 %-99 %] 98 % (10/27 1000) Last BM Date: (pta)  Intake/Output from previous day: 10/26 0701 - 10/27 0700 In: 480 [P.O.:480] Out: 2890 [Urine:2800; Chest Tube:90] Intake/Output this shift: No intake/output data recorded.  General appearance: alert, cooperative, appears older than stated age and no distress Head: Normocephalic, without obvious abnormality, atraumatic Eyes: equal, muddy sclera Resp: clear to auscultation bilaterally Chest wall: no tenderness, left sided chest wall tenderness GI: soft, non-tender; bowel sounds normal; no masses,  no organomegaly Extremities: extremities normal, atraumatic, no cyanosis or edema Pulses: 2+ and symmetric Skin: Skin color, texture, turgor normal. No rashes or lesions Neurologic: Grossly normal  Lab Results:  Recent Labs    11/22/17 2337 11/23/17 0354  WBC 4.3 4.6  HGB 11.3* 10.9*  HCT 34.9* 33.3*  PLT 219 178   BMET Recent Labs    11/22/17 2337  NA 136  K 3.7  CL 105  CO2 19*  GLUCOSE 100*  BUN 6*  CREATININE 0.78  CALCIUM 8.9   PT/INR No results for input(s): LABPROT, INR in the last 72 hours. ABG No results for input(s): PHART, HCO3 in the last 72 hours.  Invalid input(s): PCO2, PO2  Studies/Results: Dg Chest Port 1 View  Result Date: 11/25/2017 CLINICAL DATA:  Followup left chest tube and probable tiny left apical pneumothorax. EXAM: PORTABLE CHEST 1 VIEW COMPARISON:  Yesterday. FINDINGS: A small caliber pigtail chest tube remains on the left. No significant change in extensive subcutaneous emphysema on the left. Possible tiny left apical pneumothorax,  decreased. Decreased linear density at the medial left lung base. The lungs remain hyperexpanded. Thoracic spine degenerative changes and cervical spine fixation hardware. IMPRESSION: 1. Possible tiny left apical pneumothorax, decreased. 2. Decreased left basilar atelectasis. 3. Stable changes of COPD Electronically Signed   By: Beckie Salts M.D.   On: 11/25/2017 09:33   Dg Chest Port 1 View  Result Date: 11/24/2017 CLINICAL DATA:  Left-sided pneumothorax.  Chest tube. EXAM: PORTABLE CHEST 1 VIEW COMPARISON:  November 23, 2017 FINDINGS: A left-sided chest tube is in good position. Subcutaneous air remains over the left chest. I suspect a tiny left apical pneumothorax, unchanged. Mild atelectasis and tiny effusion on the left. No other interval changes. IMPRESSION: 1. Stable left chest tube. Stable air in the left chest wall. Probable tiny left apical pneumothorax. No other change. Electronically Signed   By: Gerome Sam III M.D   On: 11/24/2017 08:59    Anti-infectives: Anti-infectives (From admission, onward)   None      Assessment/Plan: Fall Multiple rib fxs, L ptx s/p CT Alcohol issues  Doing well Decreased apical PTX on CXR today, will decrease suction to  And repeat in AM Start chemical VTE prophylaxis Discussed with pt/family importance of pulm toilet Start bowel regimen Cont CIWA protocol  Mary Sella. Andrey Campanile, MD, FACS General, Bariatric, & Minimally Invasive Surgery [facility], [place]   LOS: 2 days    [name] 11/25/2017

## 2017-11-26 ENCOUNTER — Inpatient Hospital Stay (HOSPITAL_COMMUNITY): Payer: Medicare Other

## 2017-11-26 LAB — CBC
HEMATOCRIT: 32.7 % — AB (ref 39.0–52.0)
Hemoglobin: 11 g/dL — ABNORMAL LOW (ref 13.0–17.0)
MCH: 32.1 pg (ref 26.0–34.0)
MCHC: 33.6 g/dL (ref 30.0–36.0)
MCV: 95.3 fL (ref 80.0–100.0)
NRBC: 0 % (ref 0.0–0.2)
PLATELETS: 262 10*3/uL (ref 150–400)
RBC: 3.43 MIL/uL — ABNORMAL LOW (ref 4.22–5.81)
RDW: 13.5 % (ref 11.5–15.5)
WBC: 5.8 10*3/uL (ref 4.0–10.5)

## 2017-11-26 MED ORDER — LORAZEPAM 1 MG PO TABS: 1.0000 mg | ORAL_TABLET | Freq: Four times a day (QID) | ORAL | Status: DC | PRN

## 2017-11-26 MED ORDER — VITAMIN B-1 100 MG PO TABS
100.0000 mg | ORAL_TABLET | Freq: Every day | ORAL | Status: DC
  Administered 2017-11-27: 100 mg via ORAL
  Filled 2017-11-26: qty 1

## 2017-11-26 MED ORDER — FOLIC ACID 1 MG PO TABS
1.0000 mg | ORAL_TABLET | Freq: Every day | ORAL | Status: DC
  Filled 2017-11-26: qty 1

## 2017-11-26 MED ORDER — THIAMINE HCL 100 MG/ML IJ SOLN: 100.0000 mg | Freq: Every day | INTRAMUSCULAR | Status: DC

## 2017-11-26 MED ORDER — LORAZEPAM 2 MG/ML IJ SOLN: 1.0000 mg | Freq: Four times a day (QID) | INTRAMUSCULAR | Status: DC | PRN

## 2017-11-26 MED ORDER — ADULT MULTIVITAMIN W/MINERALS CH
1.0000 | ORAL_TABLET | Freq: Every day | ORAL | Status: DC
  Administered 2017-11-27: 1 via ORAL
  Filled 2017-11-26: qty 1

## 2017-11-26 NOTE — Progress Notes (Signed)
  Subjective: Feeling better  Objective: Vital signs in last 24 hours: Temp:  [97.6 F (36.4 C)-98.7 F (37.1 C)] 98.5 F (36.9 C) (10/28 0700) Pulse Rate:  [90-104] 90 (10/28 0700) Resp:  [12-21] 12 (10/28 0700) BP: (102-141)/(59-102) 115/77 (10/28 0700) SpO2:  [97 %-99 %] 99 % (10/28 0700) Last BM Date: (pta)  Intake/Output from previous day: 10/27 0701 - 10/28 0700 In: 240 [P.O.:240] Out: 1535 [Urine:1250; Chest Tube:285] Intake/Output this shift: Total I/O In: 240 [P.O.:240] Out: 330 [Urine:320; Chest Tube:10]  General appearance: cooperative Resp: clear to auscultation bilaterally Chest wall: left sided chest wall tenderness, no air leak on CT Cardio: regular rate and rhythm GI: soft, non-tender; bowel sounds normal; no masses,  no organomegaly Extremities: calves soft  Lab Results: CBC  Recent Labs    11/26/17 0632  WBC 5.8  HGB 11.0*  HCT 32.7*  PLT 262   BMET No results for input(s): NA, K, CL, CO2, GLUCOSE, BUN, CREATININE, CALCIUM in the last 72 hours. PT/INR No results for input(s): LABPROT, INR in the last 72 hours. ABG No results for input(s): PHART, HCO3 in the last 72 hours.  Invalid input(s): PCO2, PO2  Studies/Results: Dg Chest Port 1 View  Result Date: 11/26/2017 CLINICAL DATA:  Follow-up pneumothorax EXAM: PORTABLE CHEST 1 VIEW COMPARISON:  11/25/2017 FINDINGS: Left-sided pigtail catheter is again identified and stable. The left lung is well aerated with only minimal apical pneumothorax identified. The overall appearance is stable. Subcutaneous emphysema on the left is noted. The right lung remains clear. Cardiac shadow is stable. No bony abnormality is noted. IMPRESSION: Tiny left apical pneumothorax stable from the prior exam. No new focal abnormality is noted. Electronically Signed   By: Alcide Clever M.D.   On: 11/26/2017 07:56   Dg Chest Port 1 View  Result Date: 11/25/2017 CLINICAL DATA:  Followup left chest tube and probable tiny  left apical pneumothorax. EXAM: PORTABLE CHEST 1 VIEW COMPARISON:  Yesterday. FINDINGS: A small caliber pigtail chest tube remains on the left. No significant change in extensive subcutaneous emphysema on the left. Possible tiny left apical pneumothorax, decreased. Decreased linear density at the medial left lung base. The lungs remain hyperexpanded. Thoracic spine degenerative changes and cervical spine fixation hardware. IMPRESSION: 1. Possible tiny left apical pneumothorax, decreased. 2. Decreased left basilar atelectasis. 3. Stable changes of COPD Electronically Signed   By: Beckie Salts M.D.   On: 11/25/2017 09:33    Anti-infectives: Anti-infectives (From admission, onward)   None      Assessment/Plan: Fall  L rib FX 5-6 with PTX - CXR stable, place chest tube on H2O seal, CXR in AM Alcohol abuse disorder - continue CIWA (plan re-order 10/29), CSW for SBIRT C/O difficulty swallowing pills (chronic) - will have ST eval VTE - Lovenox FEN - above Dispo - above, did well with PT, reports he lives in an assisted living facility   LOS: 3 days    Violeta Gelinas, MD, MPH, FACS Trauma: [phone] General Surgery: [phone]  10/28/2019Patient ID: [name], male   DOB: 08-20-1945, 72 y.o.   MRN: [id number]

## 2017-11-26 NOTE — Plan of Care (Signed)
Patient progressed well through night.  Respiratory statue was unchanged through night.  Awaiting chest xray to evaluate change to -20.

## 2017-11-26 NOTE — Evaluation (Signed)
Clinical/Bedside Swallow Evaluation Patient Details  Name: [name] MRN: [id number] Date of Birth: [birth year]  Today's Date: 11/26/2017 Time: SLP Start Time (ACUTE ONLY): 1010 SLP Stop Time (ACUTE ONLY): 1021 SLP Time Calculation (min) (ACUTE ONLY): 11 min  Past Medical History:  Past Medical History:  Diagnosis Date  . Arthritis    KNEE,  SHOULDERS  . COPD with emphysema (HCC)   . DDD (degenerative disc disease), lumbosacral   . Dyslipidemia   . History of prostate cancer    2008--  S/P  RADICAL RETROPUBIC PROSTATECTOMY  . Urinary incontinence    Past Surgical History:  Past Surgical History:  Procedure Laterality Date  . ANTERIOR CERVICAL DECOMP/DISCECTOMY FUSION  07-18-2000   C3 -- C6  . INGUINAL HERNIA REPAIR Bilateral LEFT  12-19-2007/   RIGHT  08-11-2008  . INSERTION MALE SLING /  CYSTOSCOPY  12-01-2008   SUI  . INTERSTIM IMPLANT PLACEMENT  08-08-2010   URGE INCONTINENCE-  . INTERSTIM IMPLANT PLACEMENT N/A 05/20/2013   Procedure: REPLACEMENT INTERSTIM IMPLANT FIRST STAGE;  Surgeon: Martina Sinner, MD;  Location: [facility];  Service: Urology;  Laterality: N/A;  . INTERSTIM IMPLANT PLACEMENT N/A 05/20/2013   Procedure: REPLACEMENT INTERSTIM IMPLANT SECOND STAGE;  Surgeon: Martina Sinner, MD;  Location: [facility] ;  Service: Urology;  Laterality: N/A;  . KNEE ARTHROSCOPY Left 2008  . ROBOT ASSISTED LAPAROSCOPIC RADICAL PROSTATECTOMY  12-05-2006   W/ BILATERAL PELVIC LYMPH NODE DISSECTION  . WRIST ARTHROSCOPY WITH DEBRIDEMENT Right 05-20-2003   AND OPEN HARDWARE REMOVAL   HPI:    72 y/o male with h/o COPD with emphazema, admitted after fall in the shower with instant L flank pain.  Imaging showed L small hemopnemothorax and nondisplaced 5th and 6th ribs on the left.  Chest tube placed.  Assessment / Plan / Recommendation Clinical Impression  Patient presents with a suspected primary esophageal dysphagia characterized by c/o  globus with pills only with intact oropharyngeal swallow/airway protection noted on bedside exam this am. Provided patient with general reflux precautions including use of pureed solids with meds and upright posture to aid in esophageal clearance. Will defer further w/u (possible IP or OP esophagram and/or medication management) back to MD. No further SLP needs indicated.   SLP Visit Diagnosis: Dysphagia, unspecified (R13.10)    Aspiration Risk  Mild aspiration risk    Diet Recommendation Regular;Thin liquid   Liquid Administration via: Cup;Straw Medication Administration: Whole meds with puree(or crushed in puree if patient prefers) Supervision: Patient able to self feed Compensations: Slow rate;Small sips/bites Postural Changes: Seated upright at 90 degrees;Remain upright for at least 30 minutes after po intake    Other  Recommendations Oral Care Recommendations: Oral care BID   Follow up Recommendations None        Swallow Study   General HPI:   Type of Study: Bedside Swallow Evaluation Previous Swallow Assessment: none Diet Prior to this Study: Regular;Thin liquids Temperature Spikes Noted: No Respiratory Status: Room air History of Recent Intubation: No Behavior/Cognition: Alert;Cooperative;Pleasant mood Oral Cavity Assessment: Within Functional Limits Oral Care Completed by SLP: No Oral Cavity - Dentition: Dentures, bottom(top dentition at home) Vision: Functional for self-feeding Self-Feeding Abilities: Able to feed self Patient Positioning: Upright in bed Baseline Vocal Quality: Normal Volitional Cough: Strong Volitional Swallow: Able to elicit    Oral/Motor/Sensory Function Overall Oral Motor/Sensory Function: Within functional limits   Ice Chips Ice chips: Not tested   Thin Liquid Thin Liquid: Within functional limits  Presentation: Cup;Self Fed;Straw    Nectar Thick Nectar Thick Liquid: Not tested   Honey Thick Honey Thick Liquid: Not tested   Puree Puree:  Within functional limits Presentation: Self Fed;Spoon   Solid     Solid: Within functional limits Presentation: Self Fed     Kaydense Rizo MA, CCC-SLP   Izack Hoogland Meryl 11/26/2017,10:31 AM

## 2017-11-26 NOTE — Care Management Important Message (Signed)
Important Message  Patient Details  Name: PAULMICHAEL SCHRECK MRN: [id number] Date of Birth: 06/24/1945   Medicare Important Message Given:  Yes    Tyke Outman Stefan Church 11/26/2017, 3:45 PM

## 2017-11-26 NOTE — Social Work (Signed)
CSW attempted to see pt for SBIRT, pt toileting and pt guest requested CSW come back later. Pt is not from ALF- he resides at [facility] on [address] which are affordable housing units for seniors.  Will f/u with pt to complete SBIRT.  Octavio Graves, MSW, [facility] Clinical Social Work [phone]

## 2017-11-27 ENCOUNTER — Inpatient Hospital Stay (HOSPITAL_COMMUNITY): Payer: Medicare Other

## 2017-11-27 MED ORDER — OXYCODONE HCL 5 MG PO TABS: 5.0000 mg | ORAL_TABLET | Freq: Four times a day (QID) | ORAL | 0 refills | Status: DC | PRN

## 2017-11-27 MED ORDER — ACETAMINOPHEN 325 MG PO TABS: 650.0000 mg | ORAL_TABLET | Freq: Four times a day (QID) | ORAL | Status: DC | PRN

## 2017-11-27 MED ORDER — ADULT MULTIVITAMIN W/MINERALS CH: 1.0000 | ORAL_TABLET | Freq: Every day | ORAL | Status: DC

## 2017-11-27 MED ORDER — METHOCARBAMOL 500 MG PO TABS: 500.0000 mg | ORAL_TABLET | Freq: Three times a day (TID) | ORAL | 0 refills | Status: DC | PRN

## 2017-11-27 NOTE — Plan of Care (Signed)
  Problem: Education: Goal: Knowledge of General Education information will improve Description: Including pain rating scale, medication(s)/side effects and non-pharmacologic comfort measures Outcome: Progressing   Problem: Health Behavior/Discharge Planning: Goal: Ability to manage health-related needs will improve Outcome: Progressing   Problem: Nutrition: Goal: Adequate nutrition will be maintained Outcome: Progressing   Problem: Elimination: Goal: Will not experience complications related to bowel motility Outcome: Progressing Goal: Will not experience complications related to urinary retention Outcome: Progressing   Problem: Pain Managment: Goal: General experience of comfort will improve Outcome: Progressing   

## 2017-11-27 NOTE — Discharge Summary (Signed)
[facility] Discharge Summary   Patient ID: [name] MRN: [id number] DOB/AGE: 72/21/1947 72 y.o.  Admit date: 11/22/2017 Discharge date: 11/27/2017  Discharge Diagnosis Patient Active Problem List   Diagnosis Date Noted  . Protein-calorie malnutrition, severe 11/24/2017  . Rib fracture 11/23/2017   Fall    Alcohol abuse    . Pneumothorax on left 11/23/2017   Consultants N/A   Imaging: Dg Chest Port 1 View  Result Date: 11/27/2017 CLINICAL DATA:  History of left pneumothorax, follow-up EXAM: PORTABLE CHEST 1 VIEW COMPARISON:  Chest x-ray of 11/27/2016 and 11/26/2016 FINDINGS: The left chest tube has been removed. There is no change in the tiny left apical pneumothorax. Left chest wall subcutaneous emphysema again is noted. The lungs remain hyperaerated. Heart size is stable. IMPRESSION: 1. Removal of left chest tube. 2. No change in tiny left apical pneumothorax with left chest wall subcutaneous emphysema present. Electronically Signed   By: Dwyane Dee M.D.   On: 11/27/2017 12:08   Dg Chest Port 1 View  Result Date: 11/27/2017 CLINICAL DATA:  Follow-up left pneumothorax EXAM: PORTABLE CHEST 1 VIEW COMPARISON:  11/26/2017 FINDINGS: Cardiac shadow is within normal limits. Left-sided pigtail catheter is again seen in satisfactory position. The previously noted left pneumothorax in the apical region is stable in appearance. No new focal abnormality is noted. No bony abnormality is seen. Subcutaneous emphysema on the left is again noted. IMPRESSION: Stable left apical pneumothorax.  No acute abnormality noted. Electronically Signed   By: Alcide Clever M.D.   On: 11/27/2017 09:16   Dg Chest Port 1 View  Result Date: 11/26/2017 CLINICAL DATA:  Follow-up pneumothorax EXAM: PORTABLE CHEST 1 VIEW COMPARISON:  11/25/2017 FINDINGS: Left-sided pigtail catheter is again identified and stable. The left lung is well aerated with only minimal apical pneumothorax identified. The  overall appearance is stable. Subcutaneous emphysema on the left is noted. The right lung remains clear. Cardiac shadow is stable. No bony abnormality is noted. IMPRESSION: Tiny left apical pneumothorax stable from the prior exam. No new focal abnormality is noted. Electronically Signed   By: Alcide Clever M.D.   On: 11/26/2017 07:56    Procedures 11/23/17 - left 14 French chest tube   Hospital Course:  72 year old male with a past medical history of chronic dysphasia and alcohol abuse who presented to [facility] emergency department after a fall in the shower complaining of left chest pain.  Work-up revealed multiple left-sided rib fractures and a small apical pneumothorax.  Patient was admitted to the trauma service for observation and pain control.  He was placed on CIWA protocol for history of alcohol abuse. On hospital day #1 follow-up chest x-ray revealed a clinically significant pneumothorax and a left chest tube was placed as above.  The patient's pneumothorax improved and chest tube output decreased.  Patient did have a tiny residual apical pneumothorax that was stable following chest tube removal.  On 11/27/2017 the patient's vitals were stable, pain controlled, tolerating oral intake, and medically stable for discharge home.  He will follow-up in our office as below and knows to call with questions or concerns.  Seen by social work for SBIRT prior to discharge.  Allergies as of 11/27/2017   No Known Allergies     Medication List    TAKE these medications   acetaminophen 325 MG tablet Commonly known as:  TYLENOL Take 2 tablets (650 mg total) by mouth every 6 (six) hours as needed.   aspirin 325 MG tablet  Take 325 mg by mouth daily.   CRESTOR 10 MG tablet Generic drug:  rosuvastatin Take 10 mg by mouth every morning.   methocarbamol 500 MG tablet Commonly known as:  ROBAXIN Take 1 tablet (500 mg total) by mouth every 8 (eight) hours as needed for muscle spasms.   multivitamin  with minerals Tabs tablet Take 1 tablet by mouth daily. Start taking on:  11/28/2017   oxyCODONE 5 MG immediate release tablet Commonly known as:  Oxy IR/ROXICODONE Take 1 tablet (5 mg total) by mouth every 6 (six) hours as needed for moderate pain.   potassium chloride SA 20 MEQ tablet Commonly known as:  K-DUR,KLOR-CON Take 20 mEq by mouth daily.        Follow-up Information    [facility]. Go on 12/11/2017.   Why:  Appointment scheduled for 10:00 AM. Please arrive 30 min prior to appointment time. Bring photo ID and insurance information. Go to [facility] the day prior for follow up chest x-ray.  Contact information: [address] [place] [address] [phone]       [facility]. Go on 12/10/2017.   Why:  Go sometime during normal business hours the day prior to your trauma clinic appointment for follow up chest x-ray.  Contact information: [address] [place] [address] [phone]           Signed: Hosie Spangle, [facility] 11/27/2017, 2:08 PM

## 2017-11-27 NOTE — Discharge Instructions (Signed)
Chest Tube Insertion, Adult A chest tube is a thin, flexible tube that is inserted into the space between your lung and your chest wall. You may need a chest tube if you have a collapsed lung from illness or a severe injury. A collapsed lung can be caused by:  An air leak (pneumothorax).  Blood collection (hemothorax).  Fluid buildup from an infection (empyema).  The chest tube drains the fluid or air from your lung. It may be attached to a suction device to help with drainage. You will need to stay in the hospital while the chest tube is in place. Tell a health care provider about:  Any allergies you have.  All medicines you are taking, including vitamins, herbs, eye drops, creams, and over-the-counter medicines.  Any problems you or family members have had with anesthetic medicines.  Any blood disorders you have.  Any surgeries you have had.  Any medical conditions you have, including any recent fever or cold symptoms.  Whether you are pregnant or may be pregnant. What are the risks? Generally, this is a safe procedure. However, problems may occur, including:  Bleeding.  Infection.  Allergic reaction to medicines.  Lung damage.  Damage to the blood vessels or nerves near the lung.  Failure of the chest tube to work properly.  What happens before the procedure? Staying hydrated Follow instructions from your health care provider about hydration, which may include:  Up to 2 hours before the procedure - you may continue to drink clear liquids, such as water, clear fruit juice, black coffee, and plain tea.  Eating and drinking restrictions Follow instructions from your health care provider about eating and drinking, which may include:  8 hours before the procedure - stop eating heavy meals or foods such as meat, fried foods, or fatty foods.  6 hours before the procedure - stop eating light meals or foods, such as toast or cereal.  6 hours before the procedure -  stop drinking milk or drinks that contain milk.  2 hours before the procedure - stop drinking clear liquids.  Medicines  Ask your health care provider about: ? Changing or stopping your regular medicines. This is especially important if you are taking diabetes medicines or blood thinners. ? Taking medicines such as aspirin and ibuprofen. These medicines can thin your blood. Do not take these medicines before your procedure if your health care provider instructs you not to. General instructions  You will have a chest X-ray or other imaging studies of the lung.  Plan to have someone take you home from the hospital or clinic.  If you will be going home right after the procedure, plan to have someone with you for 24 hours. What happens during the procedure?  To reduce your risk of infection: ? Your health care team will wash or sanitize their hands. ? Your skin will be washed with soap. ? Hair may be removed from the surgical area.  An IV tube will be inserted into one of your veins.  You will be given one or more of the following: ? A medicine to help you relax (sedative). ? A medicine to numb the area (local anesthetic).  You may be given antibiotic and pain medicines through the IV tube.  The surgeon will make a small incision in a space between your ribs.  The chest tube will be placed through the incision into the space between the lung and your chest wall.  Stitches (sutures) will be used to  close the incision around the tube.  The chest tube may be attached to a suction device.  The incision site will be covered with an airtight bandage (dressing).  Another chest X-ray will be done to check the position of the tube. The procedure may vary among health care providers and hospitals. What happens after the procedure?  Your blood pressure, heart rate, breathing rate, and blood oxygen level will be monitored until the medicines you were given have worn off.  You may  continue to get pain medicine or antibiotics through the IV tube.  The tube and dressing will be checked regularly.  You will be encouraged to cough and take deep breaths.  You may be given oxygen to breathe.  Chest X-rays will be done to find out if the lung is inflating. After the lung is inflated: ? Another chest X-ray may be done. ? The chest tube can be removed after the lung remains inflated and you are breathing easily. ? A new dressing will be put on.  Do not drive for 24 hours if you were given a sedative. This information is not intended to replace advice given to you by your health care provider. Make sure you discuss any questions you have with your health care provider. Document Released: 04/26/2006 Document Revised: 08/06/2015 Document Reviewed: 06/30/2015 Elsevier Interactive Patient Education  2018 [employer] Rib Fracture A rib fracture is a break or crack in one of the bones of the ribs. The ribs are like a cage that goes around your upper chest. A broken or cracked rib is often painful, but most do not cause other problems. Most rib fractures heal on their own in 1-3 months. Follow these instructions at home:  Avoid activities that cause pain to the injured area. Protect your injured area.  Slowly increase activity as told by your doctor.  Take medicine as told by your doctor.  Put ice on the injured area for the first 1-2 days after you have been treated or as told by your doctor. ? Put ice in a plastic bag. ? Place a towel between your skin and the bag. ? Leave the ice on for 15-20 minutes at a time, every 2 hours while you are awake.  Do deep breathing as told by your doctor. You may be told to: ? Take deep breaths many times a day. ? Cough many times a day while hugging a pillow. ? Use a device (incentive spirometer) to perform deep breathing many times a day.  Drink enough fluids to keep your pee (urine) clear or pale yellow.  Do not wear a rib belt  or binder. These do not allow you to breathe deeply. Get help right away if:  You have a fever.  You have trouble breathing.  You cannot stop coughing.  You cough up thick or bloody spit (mucus).  You feel sick to your stomach (nauseous), throw up (vomit), or have belly (abdominal) pain.  Your pain gets worse and medicine does not help. This information is not intended to replace advice given to you by your health care provider. Make sure you discuss any questions you have with your health care provider. Document Released: 10/26/2007 Document Revised: 06/24/2015 Document Reviewed: 03/20/2012 Elsevier Interactive Patient Education  [employer].

## 2017-11-27 NOTE — Progress Notes (Signed)
  Subjective: Doing well, ate breakfast. No SOB.  Objective: Vital signs in last 24 hours: Temp:  [98.2 F (36.8 C)-98.7 F (37.1 C)] 98.3 F (36.8 C) (10/29 0814) Pulse Rate:  [81-105] 81 (10/29 0814) Resp:  [10-22] 14 (10/29 0814) BP: (103-130)/(66-76) 109/76 (10/29 0814) SpO2:  [96 %-100 %] 99 % (10/29 0814) Last BM Date: (pta)  Intake/Output from previous day: 10/28 0701 - 10/29 0700 In: 960 [P.O.:960] Out: 1760 [Urine:1690; Chest Tube:70] Intake/Output this shift: No intake/output data recorded.  General appearance: alert and cooperative Resp: clear to auscultation bilaterally Chest wall: left sided chest wall tenderness Cardio: regular rate and rhythm GI: soft, non-tender; bowel sounds normal; no masses,  no organomegaly Neurologic: Mental status: Alert, oriented, thought content appropriate  Lab Results: CBC  Recent Labs    11/26/17 0632  WBC 5.8  HGB 11.0*  HCT 32.7*  PLT 262   BMET No results for input(s): NA, K, CL, CO2, GLUCOSE, BUN, CREATININE, CALCIUM in the last 72 hours. PT/INR No results for input(s): LABPROT, INR in the last 72 hours. ABG No results for input(s): PHART, HCO3 in the last 72 hours.  Invalid input(s): PCO2, PO2  Studies/Results: Dg Chest Port 1 View  Result Date: 11/27/2017 CLINICAL DATA:  Follow-up left pneumothorax EXAM: PORTABLE CHEST 1 VIEW COMPARISON:  11/26/2017 FINDINGS: Cardiac shadow is within normal limits. Left-sided pigtail catheter is again seen in satisfactory position. The previously noted left pneumothorax in the apical region is stable in appearance. No new focal abnormality is noted. No bony abnormality is seen. Subcutaneous emphysema on the left is again noted. IMPRESSION: Stable left apical pneumothorax.  No acute abnormality noted. Electronically Signed   By: Alcide Clever M.D.   On: 11/27/2017 09:16   Dg Chest Port 1 View  Result Date: 11/26/2017 CLINICAL DATA:  Follow-up pneumothorax EXAM: PORTABLE CHEST 1  VIEW COMPARISON:  11/25/2017 FINDINGS: Left-sided pigtail catheter is again identified and stable. The left lung is well aerated with only minimal apical pneumothorax identified. The overall appearance is stable. Subcutaneous emphysema on the left is noted. The right lung remains clear. Cardiac shadow is stable. No bony abnormality is noted. IMPRESSION: Tiny left apical pneumothorax stable from the prior exam. No new focal abnormality is noted. Electronically Signed   By: Alcide Clever M.D.   On: 11/26/2017 07:56    Anti-infectives: Anti-infectives (From admission, onward)   None      Assessment/Plan: Fall  L rib FX 5-6 with PTX - CXR stable, remove chest tube, CXR at 1200 Alcohol abuse disorder - continue CIWA (plan re-order 10/29), CSW for SBIRT C/O difficulty swallowing pills (chronic) -appreciate ST eval VTE - Lovenox FEN - above Dispo - D/C later today if CXR OK after chest tube removal   LOS: 4 days    Violeta Gelinas, MD, MPH, FACS Trauma: [phone] General Surgery: [phone]  10/29/2019Patient ID: [name], male   DOB: 1945/12/30, 72 y.o.   MRN: [id number]

## 2017-11-27 NOTE — Care Management Note (Signed)
Case Management Note  Patient Details  Name: KAIMEN PEINE MRN: [id number] Date of Birth: [birth year]  Subjective/Objective:  Pt is a 72 y/o male with h/o COPD with emphesema, admitted after fall in the shower with instant L flank pain.  Imaging showed L small hemopnemothorax and nondisplaced 5th and 6th ribs on the left.  PTA, pt independent, lives in apartment.                  Action/Plan: PT recommending no OP follow up or DME.  Significant other able to provide assistance at dc.  No dc needs identified.  Expected Discharge Date:  11/27/17               Expected Discharge Plan:  Home/Self Care  In-House Referral:  Clinical Social Work  Discharge planning Services  CM Consult  Post Acute Care Choice:    Choice offered to:     DME Arranged:    DME Agency:     HH Arranged:    HH Agency:     Status of Service:  Completed, signed off  If discussed at [employer], dates discussed:    Additional Comments:  Quintella Baton, RN, BSN  Trauma/Neuro ICU Case Manager [phone]

## 2017-11-27 NOTE — Social Work (Signed)
CSW spoke with pt at bedside. Pt alert and oriented consented to his girlfriend Vicky staying in the room during visit. Pt admits to daily use of ETOH with a friend. He resides in [facility], which as previously documented is affordable housing for seniors, where he feels supported and that he has lots of supervision should he need anything. Pt does not feel he has a current problem with his drinking.  CSW discussed cessation and lowering use as pt ages- discussed effects ETOH can have on the body and that it often leads to worse outcomes should pt trip, slip, or fall. Pt acknowledges this but declines any additional resources.  Pt girlfriend will transport pt back home when formally discharged.  CSW signing off. Please consult if any additional needs arise.  Doy Hutching, LCSWA [facility] Clinical Social Work [phone]

## 2017-12-10 ENCOUNTER — Ambulatory Visit
Admission: RE | Admit: 2017-12-10 | Discharge: 2017-12-10 | Disposition: A | Discharge status: Home / Self Care | Payer: Medicare Other | Source: Ambulatory Visit | Attending: Physician Assistant | Admitting: Physician Assistant

## 2017-12-10 ENCOUNTER — Other Ambulatory Visit: Payer: Self-pay | Admitting: Physician Assistant

## 2017-12-10 DIAGNOSIS — J939 Pneumothorax, unspecified: Secondary | ICD-10-CM

## 2018-01-31 ENCOUNTER — Other Ambulatory Visit: Payer: Self-pay

## 2018-01-31 ENCOUNTER — Emergency Department (HOSPITAL_COMMUNITY): Payer: Medicare Other

## 2018-01-31 ENCOUNTER — Encounter (HOSPITAL_COMMUNITY): Payer: Self-pay | Admitting: Pharmacy Technician

## 2018-01-31 ENCOUNTER — Emergency Department (HOSPITAL_COMMUNITY)
Admission: EM | Admit: 2018-01-31 | Discharge: 2018-01-31 | Disposition: A | Discharge status: Home / Self Care | Payer: Medicare Other | Attending: Emergency Medicine | Admitting: Emergency Medicine

## 2018-01-31 DIAGNOSIS — J040 Acute laryngitis: Secondary | ICD-10-CM

## 2018-01-31 DIAGNOSIS — Z8546 Personal history of malignant neoplasm of prostate: Secondary | ICD-10-CM | POA: Insufficient documentation

## 2018-01-31 DIAGNOSIS — Z7982 Long term (current) use of aspirin: Secondary | ICD-10-CM | POA: Diagnosis not present

## 2018-01-31 DIAGNOSIS — R0981 Nasal congestion: Secondary | ICD-10-CM | POA: Diagnosis present

## 2018-01-31 DIAGNOSIS — J449 Chronic obstructive pulmonary disease, unspecified: Secondary | ICD-10-CM | POA: Diagnosis not present

## 2018-01-31 DIAGNOSIS — Z87891 Personal history of nicotine dependence: Secondary | ICD-10-CM | POA: Diagnosis not present

## 2018-01-31 MED ORDER — DEXAMETHASONE 4 MG PO TABS
10.0000 mg | ORAL_TABLET | Freq: Once | ORAL | Status: AC
  Administered 2018-01-31: 10 mg via ORAL
  Filled 2018-01-31: qty 3

## 2018-01-31 NOTE — ED Notes (Signed)
Patient verbalizes understanding of discharge instructions. Opportunity for questioning and answers were provided. 

## 2018-01-31 NOTE — ED Provider Notes (Signed)
[facility] EMERGENCY DEPARTMENT Provider Note   CSN: [id number] Arrival date & time: 01/31/18  2014     History   Chief Complaint Chief Complaint  Patient presents with  . URI    HPI [name] is a 73 y.o. male.  The history is provided by the patient.  URI   This is a new problem. The current episode started 2 days ago. The problem has not changed since onset.There has been no fever. The fever has been present for less than 1 day. Associated symptoms include congestion, sore throat and cough. Pertinent negatives include no chest pain, no abdominal pain, no nausea, no vomiting, no dysuria, no ear pain, no rash and no wheezing. He has tried nothing for the symptoms. The treatment provided no relief.    Past Medical History:  Diagnosis Date  . Arthritis    KNEE,  SHOULDERS  . COPD with emphysema (HCC)   . DDD (degenerative disc disease), lumbosacral   . Dyslipidemia   . History of prostate cancer    2008--  S/P  RADICAL RETROPUBIC PROSTATECTOMY  . Urinary incontinence     Patient Active Problem List   Diagnosis Date Noted  . Protein-calorie malnutrition, severe 11/24/2017  . Rib fracture 11/23/2017  . Pneumothorax on left 11/23/2017    Past Surgical History:  Procedure Laterality Date  . ANTERIOR CERVICAL DECOMP/DISCECTOMY FUSION  07-18-2000   C3 -- C6  . INGUINAL HERNIA REPAIR Bilateral LEFT  12-19-2007/   RIGHT  08-11-2008  . INSERTION MALE SLING /  CYSTOSCOPY  12-01-2008   SUI  . INTERSTIM IMPLANT PLACEMENT  08-08-2010   URGE INCONTINENCE-  . INTERSTIM IMPLANT PLACEMENT N/A 05/20/2013   Procedure: REPLACEMENT INTERSTIM IMPLANT FIRST STAGE;  Surgeon: Martina Sinner, MD;  Location: [facility];  Service: Urology;  Laterality: N/A;  . INTERSTIM IMPLANT PLACEMENT N/A 05/20/2013   Procedure: REPLACEMENT INTERSTIM IMPLANT SECOND STAGE;  Surgeon: Martina Sinner, MD;  Location: [facility];  Service: Urology;   Laterality: N/A;  . KNEE ARTHROSCOPY Left 2008  . ROBOT ASSISTED LAPAROSCOPIC RADICAL PROSTATECTOMY  12-05-2006   W/ BILATERAL PELVIC LYMPH NODE DISSECTION  . WRIST ARTHROSCOPY WITH DEBRIDEMENT Right 05-20-2003   AND OPEN HARDWARE REMOVAL        Home Medications    Prior to Admission medications   Medication Sig Start Date End Date Taking? Authorizing Provider  acetaminophen (TYLENOL) 325 MG tablet Take 2 tablets (650 mg total) by mouth every 6 (six) hours as needed. 11/27/17   Wallis Spizzirri Phenix, PA-C  aspirin 325 MG tablet Take 325 mg by mouth daily.    [provider]  methocarbamol (ROBAXIN) 500 MG tablet Take 1 tablet (500 mg total) by mouth every 8 (eight) hours as needed for muscle spasms. 11/27/17   Therese Rocco Phenix, PA-C  Multiple Vitamin (MULTIVITAMIN WITH MINERALS) TABS tablet Take 1 tablet by mouth daily. 11/28/17   Derris Millan Phenix, PA-C  oxyCODONE (OXY IR/ROXICODONE) 5 MG immediate release tablet Take 1 tablet (5 mg total) by mouth every 6 (six) hours as needed for moderate pain. 11/27/17   Linell Meldrum Phenix, PA-C  potassium chloride SA (K-DUR,KLOR-CON) 20 MEQ tablet Take 20 mEq by mouth daily.    [provider]  rosuvastatin (CRESTOR) 10 MG tablet Take 10 mg by mouth every morning.    [provider]    Family History No family history on file.  Social History Social History  Tobacco Use  . Smoking status: Former Smoker    Types: Cigarettes  . Smokeless tobacco: Current User    Types: Chew  . Tobacco comment: CHEW TOBACCO SINCE AGE 27/ QUIT SMOKING AGE 27  Substance Use Topics  . Alcohol use: Yes    Comment: pt reprots having one 12 oz can today   . Drug use: No    Comment: HX MARIJUANA (LAST USED 2009)     Allergies   Patient has no known allergies.   Review of Systems Review of Systems  Constitutional: Negative for chills and fever.  HENT: Positive for congestion and sore throat. Negative for ear pain.   Eyes:  Negative for pain and visual disturbance.  Respiratory: Positive for cough. Negative for shortness of breath and wheezing.   Cardiovascular: Negative for chest pain and palpitations.  Gastrointestinal: Negative for abdominal pain, nausea and vomiting.  Genitourinary: Negative for dysuria and hematuria.  Musculoskeletal: Negative for arthralgias and back pain.  Skin: Negative for color change and rash.  Neurological: Negative for seizures and syncope.  All other systems reviewed and are negative.    Physical Exam Updated Vital Signs  ED Triage Vitals  Enc Vitals Group     BP 01/31/18 2020 109/86     Pulse Rate 01/31/18 2020 96     Resp 01/31/18 2020 19     Temp 01/31/18 2020 97.6 F (36.4 C)     Temp Source 01/31/18 2020 Oral     SpO2 01/31/18 2020 100 %     Weight 01/31/18 2016 175 lb (79.4 kg)     Height 01/31/18 2016 5\' 8"  (1.727 m)     Head Circumference --      Peak Flow --      Pain Score 01/31/18 2018 5     Pain Loc --      Pain Edu? --      Excl. in GC? --     Physical Exam Vitals signs and nursing note reviewed.  Constitutional:      Appearance: He is well-developed.  HENT:     Head: Normocephalic and atraumatic.     Right Ear: Tympanic membrane normal.     Left Ear: Tympanic membrane normal.     Nose: Nose normal.     Mouth/Throat:     Mouth: Mucous membranes are moist.     Pharynx: Posterior oropharyngeal erythema present. No oropharyngeal exudate.  Eyes:     Extraocular Movements: Extraocular movements intact.     Conjunctiva/sclera: Conjunctivae normal.     Pupils: Pupils are equal, round, and reactive to light.  Neck:     Musculoskeletal: Normal range of motion and neck supple.  Cardiovascular:     Rate and Rhythm: Normal rate and regular rhythm.     Pulses: Normal pulses.     Heart sounds: Normal heart sounds. No murmur.  Pulmonary:     Effort: Pulmonary effort is normal. No respiratory distress.     Breath sounds: Normal breath sounds. No  stridor. No wheezing, rhonchi or rales.  Chest:     Chest wall: No tenderness.  Abdominal:     General: There is no distension.     Palpations: Abdomen is soft.     Tenderness: There is no abdominal tenderness.  Musculoskeletal: Normal range of motion.  Skin:    General: Skin is warm and dry.     Capillary Refill: Capillary refill takes less than 2 seconds.  Neurological:     General: No  focal deficit present.     Mental Status: He is alert.  Psychiatric:        Mood and Affect: Mood normal.      ED Treatments / Results  Labs (all labs ordered are listed, but only abnormal results are displayed) Labs Reviewed - No data to display  EKG EKG Interpretation  Date/Time:  Thursday January 31 2018 20:22:15 EST Ventricular Rate:  95 PR Interval:    QRS Duration: 74 QT Interval:  379 QTC Calculation: 477 R Axis:   82 Text Interpretation:  Sinus rhythm Borderline right axis deviation Borderline prolonged QT interval Confirmed by Virgina NorfolkAdam, Deral Schellenberg [phone]([id number]) on 01/31/2018 8:28:45 PM   Radiology Dg Chest 2 View  Result Date: 01/31/2018 CLINICAL DATA:  Chest pain short of breath EXAM: CHEST - 2 VIEW COMPARISON:  12/10/2017 FINDINGS: Surgical hardware in the cervical spine. Hyperinflation. Biapical pleural and parenchymal scarring. No consolidation. Normal heart size. No pneumothorax. IMPRESSION: No active cardiopulmonary disease.  Hyperinflation Electronically Signed   By: Jasmine PangKim  Fujinaga M.D.   On: 01/31/2018 21:19    Procedures Procedures (including critical care time)  Medications Ordered in ED Medications  dexamethasone (DECADRON) tablet 10 mg (10 mg Oral Given 01/31/18 2116)     Initial Impression / Assessment and Plan / ED Course  I have reviewed the triage vital signs and the nursing notes.  Pertinent labs & imaging results that were available during my care of the patient were reviewed by me and considered in my medical decision making (see chart for details).     [name] is a 73 year old male with history of COPD who presents to the ED with upper respiratory symptoms, cough.  Patient with normal vitals.  No fever.  Patient states that over the last several days he has had runny nose, cough, lost his voice.  Patient has some redness to the back of throat but no obvious exudates, signs to suggest strep throat.  Patient is clear breath sounds.  No signs of trismus, drooling, secretions.  Normal range of motion of the neck.  No signs of peritonsillar abscess or retropharyngeal abscess.  Suspect likely viral laryngitis.  Patient states that when he drinks warm fluids his voice improves and then slowly gets worse.  He has no signs of ear infection on exam.  Chest x-ray showed no signs of pneumonia, pneumothorax, pleural effusion.  Suspect likely viral process, viral laryngitis.  Given Decadron for symptomatic care.  Recommend continued use of Tylenol, Motrin, warm fluids.  If symptoms do not improve he may need ENT referral to evaluate vocal cords.  No signs of deep space infection on exam.  Given return precautions and discharged from ED in good condition.  Final Clinical Impressions(s) / ED Diagnoses   Final diagnoses:  Laryngitis    ED Discharge Orders    None       Virgina NorfolkCuratolo, Simcha Speir, DO 01/31/18 2310

## 2018-01-31 NOTE — ED Notes (Signed)
Patient transported to X-ray 

## 2018-01-31 NOTE — ED Triage Notes (Signed)
Cp and sob onset 2 days ago along with cold symptoms. Lungs cta. 104 HR, CBG 104, BP 110/70, 95% RA. 18g LAC.

## 2019-05-12 ENCOUNTER — Other Ambulatory Visit: Payer: Self-pay

## 2019-05-12 ENCOUNTER — Emergency Department (HOSPITAL_COMMUNITY)
Admission: EM | Admit: 2019-05-12 | Discharge: 2019-05-12 | Disposition: A | Discharge status: Home / Self Care | Payer: Medicare Other | Attending: Emergency Medicine | Admitting: Emergency Medicine

## 2019-05-12 ENCOUNTER — Emergency Department (HOSPITAL_COMMUNITY): Payer: Medicare Other

## 2019-05-12 DIAGNOSIS — Y92039 Unspecified place in apartment as the place of occurrence of the external cause: Secondary | ICD-10-CM | POA: Insufficient documentation

## 2019-05-12 DIAGNOSIS — R55 Syncope and collapse: Secondary | ICD-10-CM | POA: Diagnosis not present

## 2019-05-12 DIAGNOSIS — J449 Chronic obstructive pulmonary disease, unspecified: Secondary | ICD-10-CM | POA: Insufficient documentation

## 2019-05-12 DIAGNOSIS — W1830XA Fall on same level, unspecified, initial encounter: Secondary | ICD-10-CM | POA: Diagnosis not present

## 2019-05-12 DIAGNOSIS — W109XXA Fall (on) (from) unspecified stairs and steps, initial encounter: Secondary | ICD-10-CM | POA: Diagnosis not present

## 2019-05-12 DIAGNOSIS — Z7982 Long term (current) use of aspirin: Secondary | ICD-10-CM | POA: Insufficient documentation

## 2019-05-12 DIAGNOSIS — F1010 Alcohol abuse, uncomplicated: Secondary | ICD-10-CM | POA: Diagnosis not present

## 2019-05-12 DIAGNOSIS — Y999 Unspecified external cause status: Secondary | ICD-10-CM | POA: Insufficient documentation

## 2019-05-12 DIAGNOSIS — S0181XA Laceration without foreign body of other part of head, initial encounter: Secondary | ICD-10-CM | POA: Insufficient documentation

## 2019-05-12 DIAGNOSIS — Y9301 Activity, walking, marching and hiking: Secondary | ICD-10-CM | POA: Insufficient documentation

## 2019-05-12 DIAGNOSIS — Z79899 Other long term (current) drug therapy: Secondary | ICD-10-CM | POA: Insufficient documentation

## 2019-05-12 LAB — CBC
HCT: 34.7 % — ABNORMAL LOW (ref 39.0–52.0)
Hemoglobin: 12 g/dL — ABNORMAL LOW (ref 13.0–17.0)
MCH: 33.6 pg (ref 26.0–34.0)
MCHC: 34.6 g/dL (ref 30.0–36.0)
MCV: 97.2 fL (ref 80.0–100.0)
Platelets: 176 10*3/uL (ref 150–400)
RBC: 3.57 MIL/uL — ABNORMAL LOW (ref 4.22–5.81)
RDW: 13.3 % (ref 11.5–15.5)
WBC: 4.2 10*3/uL (ref 4.0–10.5)
nRBC: 0 % (ref 0.0–0.2)

## 2019-05-12 LAB — BASIC METABOLIC PANEL
Anion gap: 20 — ABNORMAL HIGH (ref 5–15)
BUN: 9 mg/dL (ref 8–23)
CO2: 18 mmol/L — ABNORMAL LOW (ref 22–32)
Calcium: 9 mg/dL (ref 8.9–10.3)
Chloride: 100 mmol/L (ref 98–111)
Creatinine, Ser: 1.2 mg/dL (ref 0.61–1.24)
GFR calc Af Amer: >60 mL/min (ref >60)
GFR calc non Af Amer: 59 mL/min — ABNORMAL LOW (ref >60)
Glucose, Bld: 108 mg/dL — ABNORMAL HIGH (ref 70–99)
Potassium: 3.5 mmol/L (ref 3.5–5.1)
Sodium: 138 mmol/L (ref 135–145)

## 2019-05-12 LAB — URINALYSIS, ROUTINE W REFLEX MICROSCOPIC
Bacteria, UA: NONE SEEN
Bilirubin Urine: NEGATIVE
Glucose, UA: NEGATIVE mg/dL
Ketones, ur: 20 mg/dL — AB
Leukocytes,Ua: NEGATIVE
Nitrite: NEGATIVE
Protein, ur: NEGATIVE mg/dL
Specific Gravity, Urine: 1.013 (ref 1.005–1.030)
pH: 5 (ref 5.0–8.0)

## 2019-05-12 LAB — CBG MONITORING, ED: Glucose-Capillary: 106 mg/dL — ABNORMAL HIGH (ref 70–99)

## 2019-05-12 MED ORDER — SODIUM CHLORIDE 0.9 % IV BOLUS (SEPSIS)
1000.0000 mL | Freq: Once | INTRAVENOUS | Status: AC
  Administered 2019-05-12: 1000 mL via INTRAVENOUS

## 2019-05-12 MED ORDER — LIDOCAINE-EPINEPHRINE-TETRACAINE (LET) TOPICAL GEL
3.0000 mL | Freq: Once | TOPICAL | Status: AC
  Administered 2019-05-12: 3 mL via TOPICAL
  Filled 2019-05-12: qty 3

## 2019-05-12 NOTE — ED Notes (Signed)
CT unable to take pt at this time d/t concern for bed bugs. CT would like pt shower first.   PA Tresa Endo informed. PA sates emergent CT due to syncope, head trauma, and intoxication. She does not feel need to delay care and it is safe for pt to shower at this time until head cleared.  RN informed CT Tech of PA need for emergant scan.

## 2019-05-12 NOTE — Discharge Instructions (Signed)
You were seen today for passing out and a head laceration.  Your work-up was reassuring.  We gave you IV fluids and repaired your cut on your face.  Please discontinue drinking alcohol as this is probably what caused your fall.  Follow-up with your primary care doctor and return to the emergency department if you have any new or worsening symptoms

## 2019-05-12 NOTE — ED Triage Notes (Signed)
Per EMS. From [facility], confirmed bedbugs.  Pt had a unwitnessed fall due to reported syncope.  Pt has a 1 cm lac to the head, is alert and oriented at this time.  Pt believes that he drank too much today.  No blood thinners.,  102/64 HR 114 RR 18 98%RA CBG 99 97.2 temp

## 2019-05-12 NOTE — ED Notes (Signed)
Discharge instructions discussed with pt pt verbalized understanding with no questions at this time. Pt to go home with family. Ambulatory at discharge

## 2019-05-12 NOTE — ED Provider Notes (Addendum)
[facility] EMERGENCY DEPARTMENT Provider Note   CSN: [id number] Arrival date & time: 05/12/19  2021     History Chief Complaint  Patient presents with  . Loss of Consciousness  . Fall    [name] is a 74 y.o. male.  Patient is a 74 y/o male with past medical history of substance abuse, COPD, presenting to the emergency department for head laceration after syncope.  Patient reports that he drank an excessive amount of alcohol today and was walking up the stairs into his house when he slipped and fell and hit his head on the floor.  He thinks that he passed out for just a couple of minutes.  Reports that currently he feels fine without any symptoms.  Denies any chest pain, shortness of breath        Past Medical History:  Diagnosis Date  . Arthritis    KNEE,  SHOULDERS  . COPD with emphysema (HCC)   . DDD (degenerative disc disease), lumbosacral   . Dyslipidemia   . History of prostate cancer    2008--  S/P  RADICAL RETROPUBIC PROSTATECTOMY  . Urinary incontinence     Patient Active Problem List   Diagnosis Date Noted  . Protein-calorie malnutrition, severe 11/24/2017  . Rib fracture 11/23/2017  . Pneumothorax on left 11/23/2017    Past Surgical History:  Procedure Laterality Date  . ANTERIOR CERVICAL DECOMP/DISCECTOMY FUSION  07-18-2000   C3 -- C6  . INGUINAL HERNIA REPAIR Bilateral LEFT  12-19-2007/   RIGHT  08-11-2008  . INSERTION MALE SLING /  CYSTOSCOPY  12-01-2008   SUI  . INTERSTIM IMPLANT PLACEMENT  08-08-2010   URGE INCONTINENCE-  . INTERSTIM IMPLANT PLACEMENT N/A 05/20/2013   Procedure: REPLACEMENT INTERSTIM IMPLANT FIRST STAGE;  Surgeon: Martina Sinner, MD;  Location: [facility] [place];  Service: Urology;  Laterality: N/A;  . INTERSTIM IMPLANT PLACEMENT N/A 05/20/2013   Procedure: REPLACEMENT INTERSTIM IMPLANT SECOND STAGE;  Surgeon: Martina Sinner, MD;  Location: [facility] [place];  Service: Urology;   Laterality: N/A;  . KNEE ARTHROSCOPY Left 2008  . ROBOT ASSISTED LAPAROSCOPIC RADICAL PROSTATECTOMY  12-05-2006   W/ BILATERAL PELVIC LYMPH NODE DISSECTION  . WRIST ARTHROSCOPY WITH DEBRIDEMENT Right 05-20-2003   AND OPEN HARDWARE REMOVAL       No family history on file.  Social History   Tobacco Use  . Smoking status: Former Smoker    Types: Cigarettes  . Smokeless tobacco: Current User    Types: Chew  . Tobacco comment: CHEW TOBACCO SINCE AGE 24/ QUIT SMOKING AGE 72  Substance Use Topics  . Alcohol use: Yes    Comment: pt reprots having one 12 oz can today   . Drug use: No    Comment: HX MARIJUANA (LAST USED 2009)    Home Medications Prior to Admission medications   Medication Sig Start Date End Date Taking? Authorizing Provider  acetaminophen (TYLENOL) 325 MG tablet Take 2 tablets (650 mg total) by mouth every 6 (six) hours as needed. 11/27/17   Adam Phenix, PA-C  aspirin 325 MG tablet Take 325 mg by mouth daily.    [provider]  methocarbamol (ROBAXIN) 500 MG tablet Take 1 tablet (500 mg total) by mouth every 8 (eight) hours as needed for muscle spasms. 11/27/17   Adam Phenix, PA-C  Multiple Vitamin (MULTIVITAMIN WITH MINERALS) TABS tablet Take 1 tablet by mouth daily. 11/28/17   Adam Phenix, PA-C  oxyCODONE (  OXY IR/ROXICODONE) 5 MG immediate release tablet Take 1 tablet (5 mg total) by mouth every 6 (six) hours as needed for moderate pain. 11/27/17   Adam PhenixSimaan, Elizabeth S, PA-C  potassium chloride SA (K-DUR,KLOR-CON) 20 MEQ tablet Take 20 mEq by mouth daily.    [provider]  rosuvastatin (CRESTOR) 10 MG tablet Take 10 mg by mouth every morning.    [provider]    Allergies    Patient has no known allergies.  Review of Systems   Review of Systems  Constitutional: Negative for appetite change, chills and fever.  HENT: Negative for congestion and sore throat.   Eyes: Negative for photophobia and visual  disturbance.  Respiratory: Negative for cough and shortness of breath.   Cardiovascular: Negative for chest pain.  Gastrointestinal: Negative for abdominal pain, nausea and vomiting.  Endocrine: Negative for polyuria.  Genitourinary: Negative for dysuria and hematuria.  Musculoskeletal: Negative for arthralgias, back pain, myalgias, neck pain and neck stiffness.  Skin: Positive for wound. Negative for rash.  Neurological: Positive for syncope. Negative for dizziness, weakness, light-headedness and headaches.  Psychiatric/Behavioral: Negative for confusion and hallucinations.    Physical Exam Updated Vital Signs BP 120/78   Pulse 86   Temp 98.1 F (36.7 C)   Resp 17   Ht 5' 8.5" (1.74 m)   Wt 65.8 kg   SpO2 98%   BMI 21.73 kg/m   Physical Exam Vitals and nursing note reviewed.  Constitutional:      General: He is not in acute distress.    Appearance: Normal appearance. He is not ill-appearing, toxic-appearing or diaphoretic.  HENT:     Head: Normocephalic.     Jaw: There is normal jaw occlusion.      Nose: Nose normal.     Mouth/Throat:     Mouth: Mucous membranes are moist.     Pharynx: Oropharynx is clear.  Eyes:     Conjunctiva/sclera: Conjunctivae normal.  Cardiovascular:     Rate and Rhythm: Normal rate and regular rhythm.  Pulmonary:     Effort: Pulmonary effort is normal.     Breath sounds: Normal breath sounds.  Abdominal:     General: Abdomen is flat.     Palpations: Abdomen is soft.  Musculoskeletal:        General: No swelling, tenderness or deformity.     Cervical back: Neck supple. No tenderness.     Right lower leg: No edema.     Left lower leg: No edema.     Comments: Grossly normal strength, sensation, range of motion in the upper and lower extremities  Skin:    General: Skin is warm and dry.  Neurological:     General: No focal deficit present.     Mental Status: He is alert and oriented to person, place, and time.     Cranial Nerves: No  cranial nerve deficit.     Sensory: No sensory deficit.     Motor: No weakness.     Coordination: Coordination normal.     Gait: Gait normal.  Psychiatric:        Mood and Affect: Mood normal.     ED Results / Procedures / Treatments   Labs (all labs ordered are listed, but only abnormal results are displayed) Labs Reviewed  BASIC METABOLIC PANEL - Abnormal; Notable for the following components:      Result Value   CO2 18 (*)    Glucose, Bld 108 (*)    GFR calc  non Af Amer 59 (*)    Anion gap 20 (*)    All other components within normal limits  CBC - Abnormal; Notable for the following components:   RBC 3.57 (*)    Hemoglobin 12.0 (*)    HCT 34.7 (*)    All other components within normal limits  URINALYSIS, ROUTINE W REFLEX MICROSCOPIC - Abnormal; Notable for the following components:   Hgb urine dipstick SMALL (*)    Ketones, ur 20 (*)    All other components within normal limits  CBG MONITORING, ED - Abnormal; Notable for the following components:   Glucose-Capillary 106 (*)    All other components within normal limits  CBG MONITORING, ED    EKG EKG Interpretation  Date/Time:  Monday May 12 2019 20:33:09 EDT Ventricular Rate:  104 PR Interval:    QRS Duration: 80 QT Interval:  390 QTC Calculation: 513 R Axis:   80 Text Interpretation: Sinus tachycardia Consider left atrial enlargement Nonspecific T abnrm, anterolateral leads Prolonged QT interval No STEMI Confirmed by Alona Bene [id number]) on 05/13/2019 5:33:45 PM   Radiology CT Head Wo Contrast  Result Date: 05/12/2019 CLINICAL DATA:  Unwitnessed fall with laceration EXAM: CT HEAD WITHOUT CONTRAST TECHNIQUE: Contiguous axial images were obtained from the base of the skull through the vertex without intravenous contrast. COMPARISON:  CT brain 09/19/2016 FINDINGS: Brain: No acute territorial infarction, hemorrhage, or intracranial mass. Mild atrophy. Stable ventricle size Vascular: No hyperdense vessels.  Carotid  vascular calcification Skull: Normal. Negative for fracture or focal lesion. Sinuses/Orbits: Postsurgical changes of the right maxillary sinus. Mucosal thickening in the maxillary and ethmoid sinuses. Other: Right forehead scalp laceration IMPRESSION: 1. No CT evidence for acute intracranial abnormality. 2. Atrophy Electronically Signed   By: Jasmine Pang M.D.   On: 05/12/2019 22:20   CT Cervical Spine Wo Contrast  Result Date: 05/12/2019 CLINICAL DATA:  Unwitnessed fall EXAM: CT CERVICAL SPINE WITHOUT CONTRAST TECHNIQUE: Multidetector CT imaging of the cervical spine was performed without intravenous contrast. Multiplanar CT image reconstructions were also generated. COMPARISON:  Radiograph 05/14/2013, CT 09/23/2017 FINDINGS: Alignment: Straightening of the cervical spine. Facet alignment is within normal limits. Skull base and vertebrae: No acute fracture. No primary bone lesion or focal pathologic process. Soft tissues and spinal canal: No prevertebral fluid or swelling. No visible canal hematoma. Disc levels: Post fusion changes C3 through C6. Suspected chronic screw fracture of left fixating screw at C3. Advanced degenerative changes C6-C7 and C7-T1. Moderate C2-C3 degenerative change. Bulky hypertrophic facet degenerative change at multiple levels. Multiple level foraminal stenosis. Upper chest: Incompletely visualized scarring at the left apex. Other: None IMPRESSION: Post fusion changes C3 through C6 without definite acute osseous abnormality. Electronically Signed   By: Jasmine Pang M.D.   On: 05/12/2019 22:28    Procedures .Marland KitchenLaceration Repair  Date/Time: 05/13/2019 8:25 PM Performed by: Arlyn Dunning, PA-C Authorized by: Arlyn Dunning, PA-C   Consent:    Consent obtained:  Verbal   Consent given by:  Patient   Risks discussed:  Infection, need for additional repair, pain, poor cosmetic result and poor wound healing   Alternatives discussed:  No treatment and delayed  treatment Universal protocol:    Procedure explained and questions answered to patient or proxy's satisfaction: yes     Relevant documents present and verified: yes     Test results available and properly labeled: yes     Imaging studies available: yes     Required blood products, implants,  devices, and special equipment available: yes     Site/side marked: yes     Immediately prior to procedure, a time out was called: yes     Patient identity confirmed:  Verbally with patient Anesthesia (see MAR for exact dosages):    Anesthesia method:  Local infiltration and topical application   Topical anesthetic:  LET Laceration details:    Location:  Face   Face location:  Forehead   Length (cm):  1   Depth (mm):  6 Repair type:    Repair type:  Simple Pre-procedure details:    Preparation:  Patient was prepped and draped in usual sterile fashion Exploration:    Hemostasis achieved with:  LET   Wound exploration: wound explored through full range of motion   Treatment:    Area cleansed with:  Soap and water and saline   Amount of cleaning:  Standard   Irrigation method:  Pressure wash Skin repair:    Repair method:  Sutures   Suture size:  6-0   Suture material:  Prolene   Suture technique:  Simple interrupted Approximation:    Approximation:  Close Post-procedure details:    Dressing:  Antibiotic ointment and non-adherent dressing   Patient tolerance of procedure:  Tolerated well, no immediate complications   (including critical care time)  Medications Ordered in ED Medications  sodium chloride 0.9 % bolus 1,000 mL (0 mLs Intravenous Stopped 05/12/19 2240)  lidocaine-EPINEPHrine-tetracaine (LET) topical gel (3 mLs Topical Given 05/12/19 2102)    ED Course  I have reviewed the triage vital signs and the nursing notes.  Pertinent labs & imaging results that were available during my care of the patient were reviewed by me and considered in my medical decision making (see chart  for details).  Clinical Course as of May 12 2024  Mon May 12, 2019  2236 Elderly patient presenting for ETOH intoxication and fall with hyead trauma. Currently well appearing and he reports he is feeling fine. Workup unremarkable. He was given a liter of fluids. Heat and neck CT reassuring. Facial lac repaired. Will d/c home to family members who will pick the patient up. Seen by Dr. Stevie Kern as well and plan agreed upon.    [KM]    Clinical Course User Index [KM] Jeral Pinch   MDM Rules/Calculators/A&P                      Based on review of vitals, medical screening exam, lab work and/or imaging, there does not appear to be an acute, emergent etiology for the patient's symptoms. Counseled pt on good return precautions and encouraged both PCP and ED follow-up as needed.  Prior to discharge, I also discussed incidental imaging findings with patient in detail and advised appropriate, recommended follow-up in detail.  Clinical Impression: 1. Syncope, unspecified syncope type   2. Facial laceration, initial encounter   3. ETOH abuse     Disposition: Discharge  Prior to providing a prescription for a controlled substance, I independently reviewed the patient's recent prescription history on the [place] Controlled Substance Reporting System. The patient had no recent or regular prescriptions and was deemed appropriate for a brief, less than 3 day prescription of narcotic for acute analgesia.  This note was prepared with assistance of Conservation officer, historic buildings. Occasional wrong-word or sound-a-like substitutions may have occurred due to the inherent limitations of voice recognition software.  Final Clinical Impression(s) / ED Diagnoses Final diagnoses:  Syncope,  unspecified syncope type  Facial laceration, initial encounter  ETOH abuse    Rx / DC Orders ED Discharge Orders    None       Kristine Royal 05/12/19 2307    Alveria Apley,  PA-C 05/13/19 2026    Lucrezia Starch, MD 05/14/19 1256

## 2019-05-12 NOTE — ED Notes (Signed)
Pt to CT

## 2019-07-24 IMAGING — CR DG CHEST 2V
2 series · 2 of 2 positions shown · non-contrast
Comparison: 11/27/2017

CLINICAL DATA: Fell and fractured the left ribs, follow-up
pneumothorax

EXAM:
CHEST - 2 VIEW

[w chest pa]
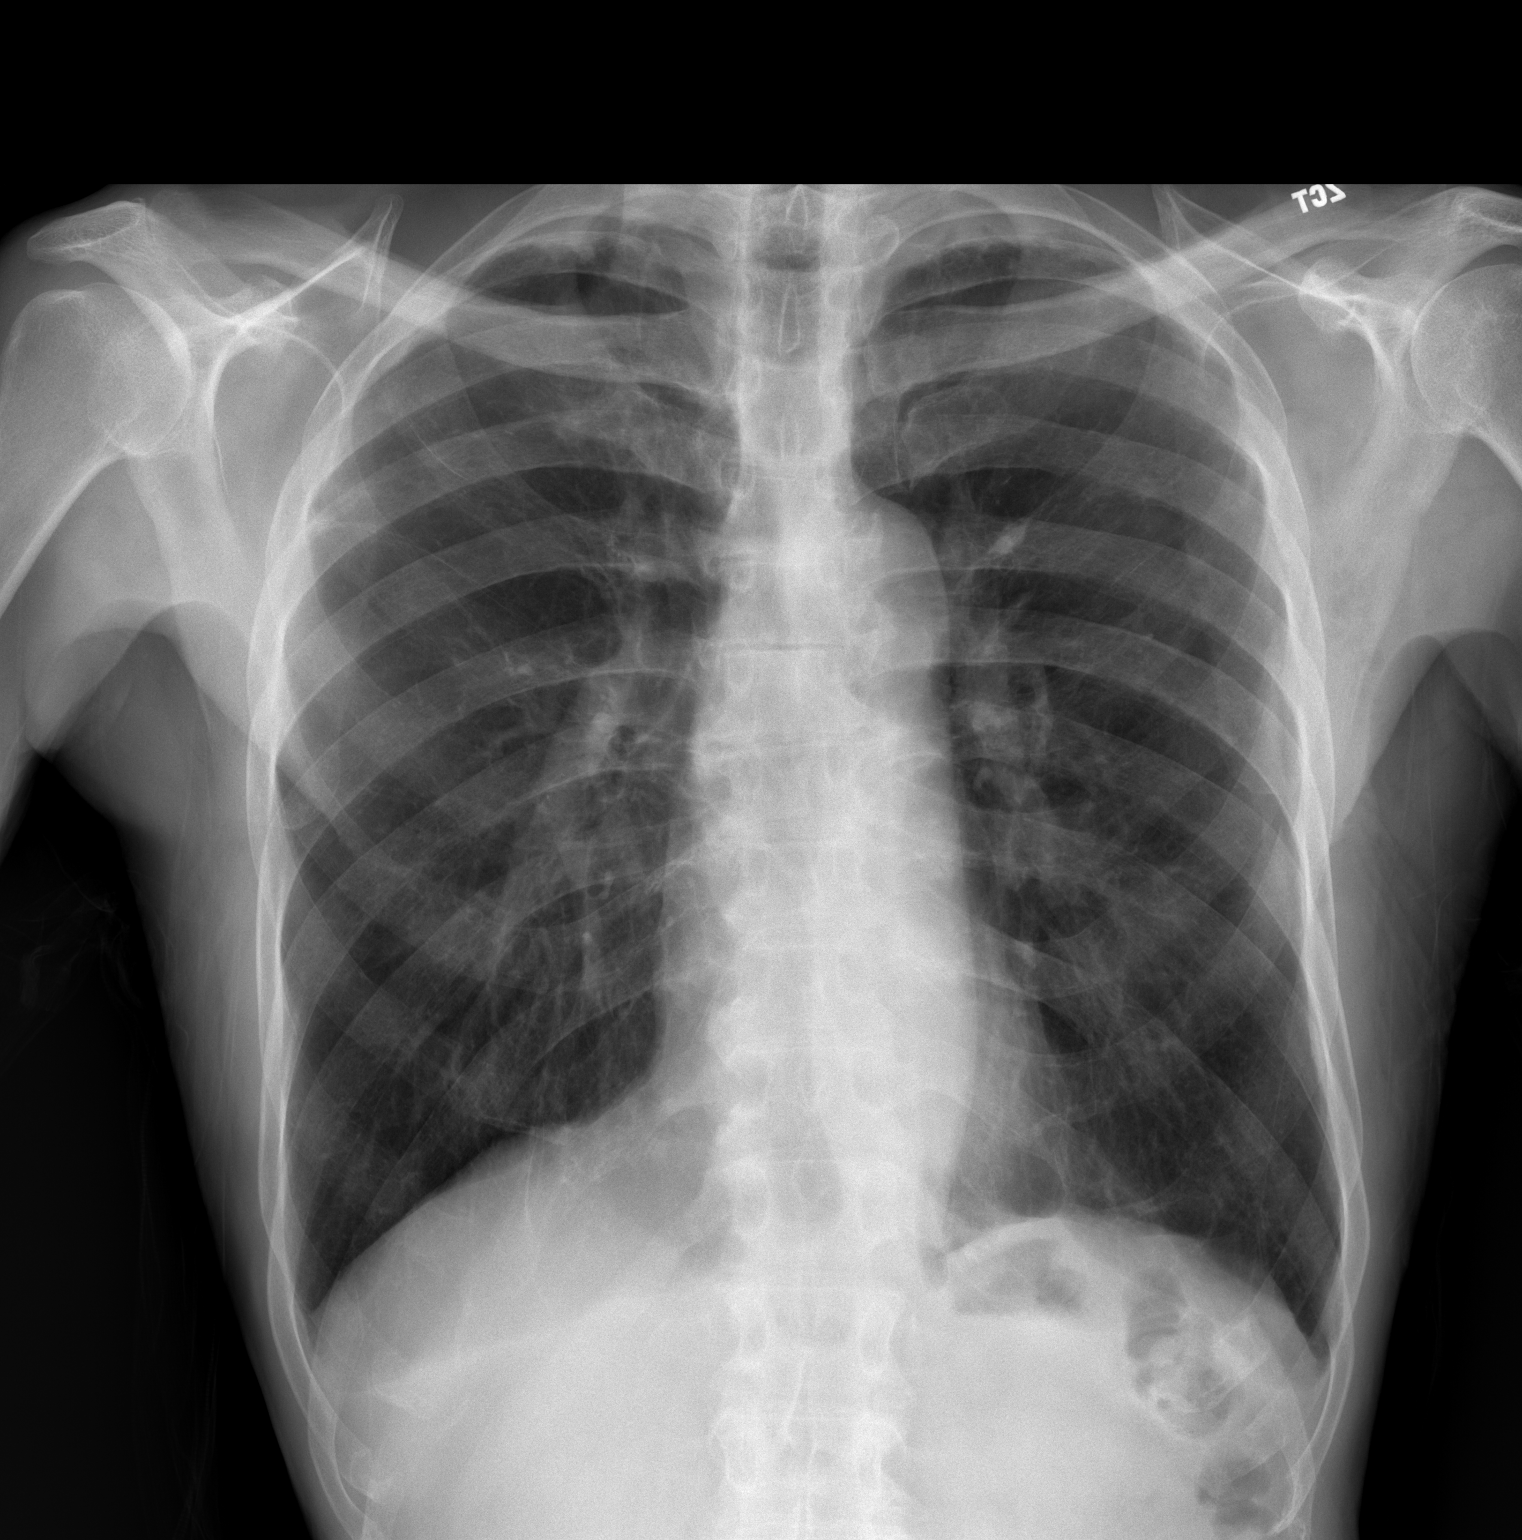

[w chest lat]
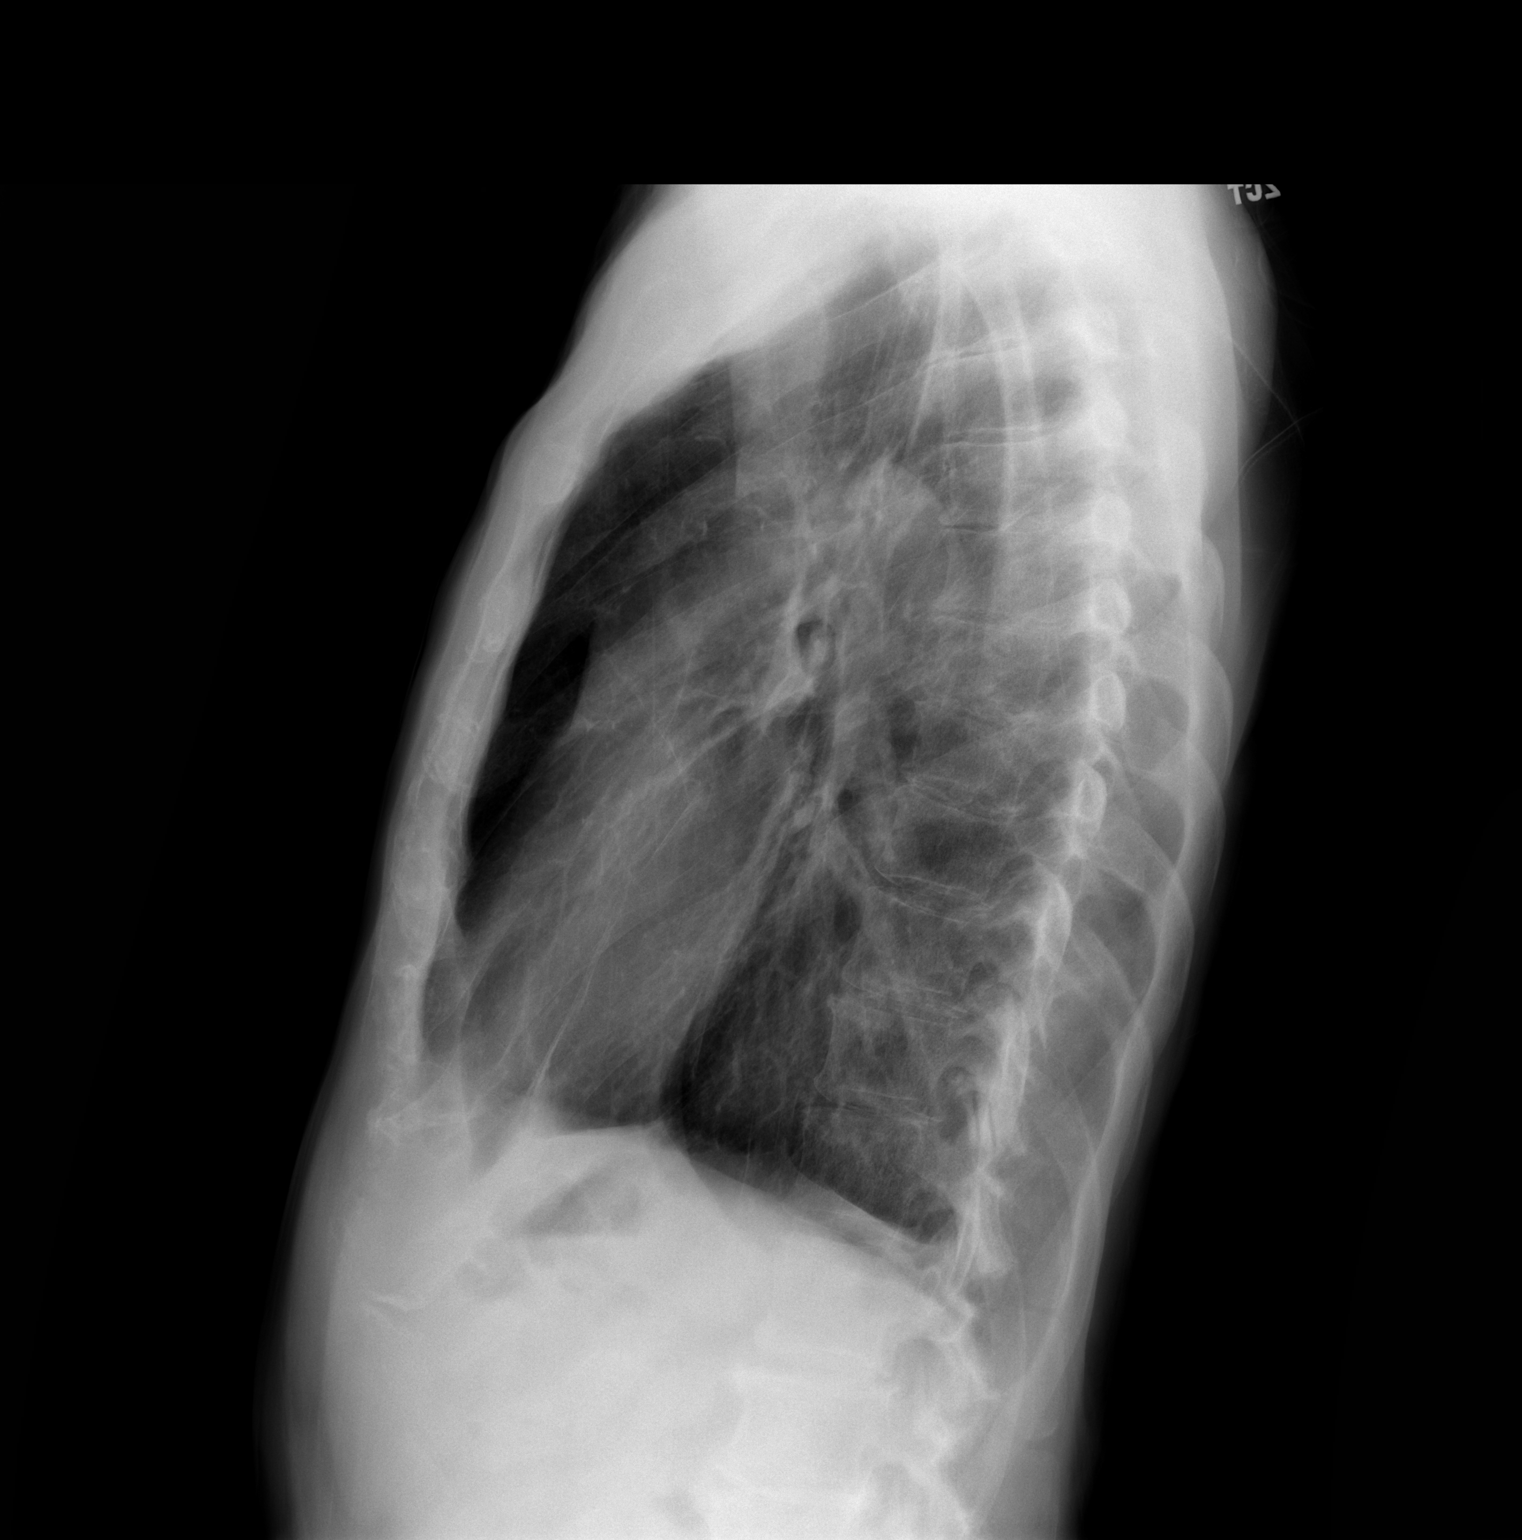

[2 of 2 positions shown; findings below may reference images not displayed]

FINDINGS: Postsurgical changes of the cervical spine. No acute opacity or
pleural effusion. Left fifth and sixth displaced rib fractures.
Resolution of left apical pneumothorax. Mild apical pleural and
parenchymal scarring. Near complete resolution of subcutaneous
emphysema on the left.
IMPRESSION: 1. Left-sided rib fractures. Resolution of previously noted tiny
left apical pneumothorax.
2. Near complete resolution of previously noted left chest wall soft
tissue emphysema.

## 2020-03-25 ENCOUNTER — Other Ambulatory Visit: Payer: Self-pay | Admitting: Student

## 2020-03-25 DIAGNOSIS — K753 Granulomatous hepatitis, not elsewhere classified: Secondary | ICD-10-CM

## 2020-03-25 DIAGNOSIS — R748 Abnormal levels of other serum enzymes: Secondary | ICD-10-CM

## 2020-04-12 ENCOUNTER — Other Ambulatory Visit: Payer: Medicare Other

## 2021-04-08 ENCOUNTER — Other Ambulatory Visit: Payer: Self-pay | Admitting: Nurse Practitioner

## 2021-04-08 DIAGNOSIS — R748 Abnormal levels of other serum enzymes: Secondary | ICD-10-CM

## 2021-04-08 DIAGNOSIS — K760 Fatty (change of) liver, not elsewhere classified: Secondary | ICD-10-CM

## 2021-04-18 ENCOUNTER — Ambulatory Visit
Admission: RE | Admit: 2021-04-18 | Discharge: 2021-04-18 | Disposition: A | Discharge status: Home / Self Care | Payer: Medicare Other | Source: Ambulatory Visit | Attending: Nurse Practitioner | Admitting: Nurse Practitioner

## 2021-04-18 DIAGNOSIS — R748 Abnormal levels of other serum enzymes: Secondary | ICD-10-CM

## 2021-04-18 DIAGNOSIS — K760 Fatty (change of) liver, not elsewhere classified: Secondary | ICD-10-CM

## 2021-07-29 ENCOUNTER — Other Ambulatory Visit (HOSPITAL_COMMUNITY): Payer: Self-pay | Admitting: Nurse Practitioner

## 2021-07-29 ENCOUNTER — Other Ambulatory Visit: Payer: Self-pay | Admitting: Nurse Practitioner

## 2021-07-29 DIAGNOSIS — K7469 Other cirrhosis of liver: Secondary | ICD-10-CM

## 2021-07-29 DIAGNOSIS — R748 Abnormal levels of other serum enzymes: Secondary | ICD-10-CM

## 2021-08-17 ENCOUNTER — Other Ambulatory Visit (HOSPITAL_COMMUNITY): Payer: Self-pay | Admitting: Physician Assistant

## 2021-08-17 DIAGNOSIS — Z01818 Encounter for other preprocedural examination: Secondary | ICD-10-CM

## 2021-08-18 ENCOUNTER — Other Ambulatory Visit: Payer: Self-pay

## 2021-08-18 ENCOUNTER — Encounter (HOSPITAL_COMMUNITY): Payer: Self-pay

## 2021-08-18 ENCOUNTER — Ambulatory Visit (HOSPITAL_COMMUNITY)
Admission: RE | Admit: 2021-08-18 | Discharge: 2021-08-18 | Disposition: A | Discharge status: Home / Self Care | Payer: Medicare Other | Source: Ambulatory Visit | Attending: Nurse Practitioner | Admitting: Nurse Practitioner

## 2021-08-18 DIAGNOSIS — K7469 Other cirrhosis of liver: Secondary | ICD-10-CM | POA: Insufficient documentation

## 2021-08-18 DIAGNOSIS — Z01818 Encounter for other preprocedural examination: Secondary | ICD-10-CM | POA: Diagnosis present

## 2021-08-18 DIAGNOSIS — R748 Abnormal levels of other serum enzymes: Secondary | ICD-10-CM | POA: Diagnosis present

## 2021-08-18 MED ORDER — SODIUM CHLORIDE 0.9 % IV SOLN: INTRAVENOUS | Status: DC

## 2021-09-05 ENCOUNTER — Other Ambulatory Visit: Payer: Self-pay | Admitting: Radiology

## 2021-09-05 DIAGNOSIS — R748 Abnormal levels of other serum enzymes: Secondary | ICD-10-CM

## 2021-09-05 DIAGNOSIS — Z9189 Other specified personal risk factors, not elsewhere classified: Secondary | ICD-10-CM

## 2021-09-06 ENCOUNTER — Encounter (HOSPITAL_COMMUNITY): Payer: Self-pay

## 2021-09-06 ENCOUNTER — Other Ambulatory Visit: Payer: Self-pay

## 2021-09-06 ENCOUNTER — Ambulatory Visit (HOSPITAL_COMMUNITY)
Admission: RE | Admit: 2021-09-06 | Discharge: 2021-09-06 | Disposition: A | Discharge status: Home / Self Care | Payer: Medicare Other | Source: Ambulatory Visit | Attending: Nurse Practitioner | Admitting: Nurse Practitioner

## 2021-09-06 DIAGNOSIS — R945 Abnormal results of liver function studies: Secondary | ICD-10-CM | POA: Insufficient documentation

## 2021-09-06 DIAGNOSIS — R748 Abnormal levels of other serum enzymes: Secondary | ICD-10-CM

## 2021-09-06 DIAGNOSIS — R7989 Other specified abnormal findings of blood chemistry: Secondary | ICD-10-CM | POA: Diagnosis present

## 2021-09-06 DIAGNOSIS — Z9189 Other specified personal risk factors, not elsewhere classified: Secondary | ICD-10-CM

## 2021-09-06 DIAGNOSIS — K7460 Unspecified cirrhosis of liver: Secondary | ICD-10-CM | POA: Diagnosis not present

## 2021-09-06 LAB — CBC
HCT: 34.2 % — ABNORMAL LOW (ref 39.0–52.0)
Hemoglobin: 11.4 g/dL — ABNORMAL LOW (ref 13.0–17.0)
MCH: 32.2 pg (ref 26.0–34.0)
MCHC: 33.3 g/dL (ref 30.0–36.0)
MCV: 96.6 fL (ref 80.0–100.0)
Platelets: 210 10*3/uL (ref 150–400)
RBC: 3.54 MIL/uL — ABNORMAL LOW (ref 4.22–5.81)
RDW: 13.2 % (ref 11.5–15.5)
WBC: 5.7 10*3/uL (ref 4.0–10.5)
nRBC: 0 % (ref 0.0–0.2)

## 2021-09-06 LAB — PROTIME-INR
INR: 1.2 (ref 0.8–1.2)
Prothrombin Time: 14.7 seconds (ref 11.4–15.2)

## 2021-09-06 MED ORDER — LIDOCAINE HCL (PF) 1 % IJ SOLN
INTRAMUSCULAR | Status: AC
  Filled 2021-09-06: qty 30

## 2021-09-06 MED ORDER — MIDAZOLAM HCL 2 MG/2ML IJ SOLN
INTRAMUSCULAR | Status: AC
  Filled 2021-09-06: qty 2

## 2021-09-06 MED ORDER — FENTANYL CITRATE (PF) 100 MCG/2ML IJ SOLN
INTRAMUSCULAR | Status: AC | PRN
  Administered 2021-09-06 (×2): 25 ug via INTRAVENOUS

## 2021-09-06 MED ORDER — MIDAZOLAM HCL 2 MG/2ML IJ SOLN
INTRAMUSCULAR | Status: AC | PRN
  Administered 2021-09-06: 1 mg via INTRAVENOUS
  Administered 2021-09-06: .5 mg via INTRAVENOUS

## 2021-09-06 MED ORDER — GELATIN ABSORBABLE 12-7 MM EX MISC
CUTANEOUS | Status: AC
  Filled 2021-09-06: qty 1

## 2021-09-06 MED ORDER — SODIUM CHLORIDE 0.9 % IV SOLN: INTRAVENOUS | Status: DC

## 2021-09-06 MED ORDER — ONDANSETRON HCL 4 MG/2ML IJ SOLN
INTRAMUSCULAR | Status: AC | PRN
  Administered 2021-09-06: 4 mg via INTRAVENOUS

## 2021-09-06 MED ORDER — FENTANYL CITRATE (PF) 100 MCG/2ML IJ SOLN
INTRAMUSCULAR | Status: AC
  Filled 2021-09-06: qty 2

## 2021-09-06 MED ORDER — ONDANSETRON HCL 4 MG/2ML IJ SOLN
INTRAMUSCULAR | Status: AC
  Filled 2021-09-06: qty 2

## 2021-09-06 NOTE — Sedation Documentation (Signed)
Pt denies nausea at this time.

## 2021-09-06 NOTE — Procedures (Signed)
Interventional Radiology Procedure Note  Procedure: US guided random liver biopsy  Indication: Elevated liver enzymes  Findings: Please refer to procedural dictation for full description.  Complications: None  EBL: < 10 mL  Minette Manders, MD [phone]   

## 2021-09-06 NOTE — H&P (Signed)
Chief Complaint: Patient was seen in consultation today for liver core biopsy at the request of Drazek,Dawn  Referring Physician(s): Drazek,Dawn  Supervising Physician: Mir, Mauri Reading  Patient Status: [facility] - Out-pt  History of Present Illness: [name] is a 76 y.o. male   Pt was found to have elevated liver functions per PCP. No symptoms per pt Followed for while-- persistent elevation and was referred to Liver Clinic with Annamarie Major NP  Now scheduled for liver core biopsy   Past Medical History:  Diagnosis Date   Arthritis    KNEE,  SHOULDERS   COPD with emphysema (HCC)    DDD (degenerative disc disease), lumbosacral    Dyslipidemia    History of prostate cancer    2008--  S/P  RADICAL RETROPUBIC PROSTATECTOMY   Urinary incontinence     Past Surgical History:  Procedure Laterality Date   ANTERIOR CERVICAL DECOMP/DISCECTOMY FUSION  07-18-2000   C3 -- C6   INGUINAL HERNIA REPAIR Bilateral LEFT  12-19-2007/   RIGHT  08-11-2008   INSERTION MALE SLING /  CYSTOSCOPY  12-01-2008   SUI   INTERSTIM IMPLANT PLACEMENT  08-08-2010   URGE INCONTINENCE-   INTERSTIM IMPLANT PLACEMENT N/A 05/20/2013   Procedure: REPLACEMENT INTERSTIM IMPLANT FIRST STAGE;  Surgeon: Martina Sinner, MD;  Location: [facility] [place];  Service: Urology;  Laterality: N/A;   INTERSTIM IMPLANT PLACEMENT N/A 05/20/2013   Procedure: REPLACEMENT INTERSTIM IMPLANT SECOND STAGE;  Surgeon: Martina Sinner, MD;  Location: [facility] [place];  Service: Urology;  Laterality: N/A;   KNEE ARTHROSCOPY Left 2008   ROBOT ASSISTED LAPAROSCOPIC RADICAL PROSTATECTOMY  12-05-2006   W/ BILATERAL PELVIC LYMPH NODE DISSECTION   WRIST ARTHROSCOPY WITH DEBRIDEMENT Right 05-20-2003   AND OPEN HARDWARE REMOVAL    Allergies: Patient has no known allergies.  Medications: Prior to Admission medications   Medication Sig Start Date End Date Taking? Authorizing Provider  levothyroxine  (SYNTHROID) 50 MCG tablet Take 50 mcg by mouth daily before breakfast. 03/18/21  Yes [provider]     History reviewed. No pertinent family history.  Social History   Socioeconomic History   Marital status: Single    Spouse name: Not on file   Number of children: Not on file   Years of education: Not on file   Highest education level: Not on file  Occupational History   Not on file  Tobacco Use   Smoking status: Former    Types: Cigarettes   Smokeless tobacco: Current    Types: Chew   Tobacco comments:    CHEW TOBACCO SINCE AGE 31/ QUIT SMOKING AGE 3  Substance and Sexual Activity   Alcohol use: Yes    Comment: pt reprots having one 12 oz can today    Drug use: No    Comment: HX MARIJUANA (LAST USED 2009)   Sexual activity: Not on file  Other Topics Concern   Not on file  Social History Narrative   Not on file   Social Determinants of Health   Financial Resource Strain: Not on file  Food Insecurity: Not on file  Transportation Needs: Not on file  Physical Activity: Not on file  Stress: Not on file  Social Connections: Not on file    Review of Systems: A 12 point ROS discussed and pertinent positives are indicated in the HPI above.  All other systems are negative.  Review of Systems  Constitutional:  Negative for activity change, fatigue and fever.  Respiratory:  Negative for cough and shortness of breath.   Cardiovascular:  Negative for chest pain.  Gastrointestinal:  Negative for abdominal pain, diarrhea, nausea and vomiting.  Skin:  Negative for color change.  Neurological:  Negative for weakness.  Psychiatric/Behavioral:  Negative for behavioral problems and confusion.     Vital Signs: BP (!) 150/76   Pulse 70   Temp 97.6 F (36.4 C) (Oral)   Resp 16   Ht 5' 8.5" (1.74 m)   Wt 140 lb (63.5 kg)   SpO2 100%   BMI 20.98 kg/m    Physical Exam Vitals reviewed.  HENT:     Mouth/Throat:     Mouth: Mucous membranes are moist.   Cardiovascular:     Rate and Rhythm: Normal rate and regular rhythm.     Heart sounds: Normal heart sounds.  Pulmonary:     Effort: Pulmonary effort is normal.     Breath sounds: Normal breath sounds.  Abdominal:     Palpations: Abdomen is soft.     Tenderness: There is no abdominal tenderness.  Musculoskeletal:        General: Normal range of motion.  Skin:    General: Skin is warm.  Neurological:     Mental Status: He is alert and oriented to person, place, and time.  Psychiatric:        Behavior: Behavior normal.     Imaging: No results found.  Labs:  CBC: No results for input(s): "WBC", "HGB", "HCT", "PLT" in the last 8760 hours.  COAGS: No results for input(s): "INR", "APTT" in the last 8760 hours.  BMP: No results for input(s): "NA", "K", "CL", "CO2", "GLUCOSE", "BUN", "CALCIUM", "CREATININE", "GFRNONAA", "GFRAA" in the last 8760 hours.  Invalid input(s): "CMP"  LIVER FUNCTION TESTS: No results for input(s): "BILITOT", "AST", "ALT", "ALKPHOS", "PROT", "ALBUMIN" in the last 8760 hours.  TUMOR MARKERS: No results for input(s): "AFPTM", "CEA", "CA199", "CHROMGRNA" in the last 8760 hours.  Assessment and Plan:  Elevated liver functions Asymptomatic Scheduled for liver core biopsy per D Drazek NP Risks and benefits of liver core biopsy was discussed with the patient and/or patient's family including, but not limited to bleeding, infection, damage to adjacent structures or low yield requiring additional tests.  All of the questions were answered and there is agreement to proceed.  Consent signed and in chart.   Thank you for this interesting consult.  I greatly enjoyed meeting [facility] [place] [facility] and look forward to participating in their care.  A copy of this report was sent to the requesting provider on this date.  Electronically Signed: Robet Leu, PA-C 09/06/2021, 11:41 AM   I spent a total of  30 Minutes   in face to face in clinical consultation,  greater than 50% of which was counseling/coordinating care for liver core biopsy

## 2021-09-08 LAB — SURGICAL PATHOLOGY

## 2021-10-10 ENCOUNTER — Emergency Department (HOSPITAL_COMMUNITY)
Admission: EM | Admit: 2021-10-10 | Discharge: 2021-10-11 | Disposition: A | Discharge status: Home / Self Care | Payer: 59 | Attending: Emergency Medicine | Admitting: Emergency Medicine

## 2021-10-10 ENCOUNTER — Emergency Department (HOSPITAL_COMMUNITY): Payer: 59

## 2021-10-10 ENCOUNTER — Encounter (HOSPITAL_COMMUNITY): Payer: Self-pay

## 2021-10-10 ENCOUNTER — Other Ambulatory Visit: Payer: Self-pay

## 2021-10-10 DIAGNOSIS — M25552 Pain in left hip: Secondary | ICD-10-CM | POA: Insufficient documentation

## 2021-10-10 DIAGNOSIS — M25511 Pain in right shoulder: Secondary | ICD-10-CM | POA: Insufficient documentation

## 2021-10-10 DIAGNOSIS — M545 Low back pain, unspecified: Secondary | ICD-10-CM | POA: Diagnosis not present

## 2021-10-10 MED ORDER — METHOCARBAMOL 500 MG PO TABS
500.0000 mg | ORAL_TABLET | Freq: Once | ORAL | Status: AC
  Administered 2021-10-10: 500 mg via ORAL
  Filled 2021-10-10: qty 1

## 2021-10-10 NOTE — ED Provider Notes (Incomplete)
[facility] EMERGENCY DEPARTMENT Provider Note   CSN: [id number] Arrival date & time: 10/10/21  1418     History {Add pertinent medical, surgical, social history, OB history to HPI:1} Chief Complaint  Patient presents with  . Hip Pain    [name] is a 76 y.o. male.  With a history of arthritis, degenerative disc disease who presents to the ED for evaluation of left hip pain.  Patient reports pain started 3 days ago with gradual onset.  Reports he only feels the pain when he is walking.  Describes the pain as sharp without radiation.  Denies falls or injuries.  Reports he has not had this pain before.  Also reports mild right shoulder and trapezius pain that is only present with movement.  Denies numbness, weakness, tingling.   Hip Pain       Home Medications Prior to Admission medications   Medication Sig Start Date End Date Taking? Authorizing Provider  levothyroxine (SYNTHROID) 50 MCG tablet Take 50 mcg by mouth daily before breakfast. 03/18/21   [provider]      Allergies    Patient has no known allergies.    Review of Systems   Review of Systems  Musculoskeletal:  Positive for arthralgias, back pain and myalgias.    Physical Exam Updated Vital Signs BP 138/82 (BP Location: Left Arm)   Pulse 93   Temp 99.3 F (37.4 C) (Oral)   Resp 18   Ht 5\' 8"  (1.727 m)   Wt 63.5 kg   SpO2 100%   BMI 21.29 kg/m  Physical Exam Vitals and nursing note reviewed.  Constitutional:      General: He is not in acute distress.    Appearance: Normal appearance. He is normal weight. He is not ill-appearing.  HENT:     Head: Normocephalic and atraumatic.  Cardiovascular:     Pulses:          Radial pulses are 2+ on the right side and 2+ on the left side.       Dorsalis pedis pulses are 2+ on the right side and 2+ on the left side.  Pulmonary:     Effort: Pulmonary effort is normal. No respiratory distress.  Abdominal:     General: Abdomen  is flat.  Musculoskeletal:        General: Normal range of motion.     Cervical back: Neck supple.     Right hip: Normal.     Left hip: Tenderness present. Normal range of motion.     Right lower leg: No edema.     Left lower leg: No edema.     Comments: Left hip tender at the trochanter and in the buttock  Skin:    General: Skin is warm and dry.  Neurological:     Mental Status: He is alert and oriented to person, place, and time.  Psychiatric:        Mood and Affect: Mood normal.        Behavior: Behavior normal.     ED Results / Procedures / Treatments   Labs (all labs ordered are listed, but only abnormal results are displayed) Labs Reviewed - No data to display  EKG None  Radiology DG Hip Unilat With Pelvis 2-3 Views Left  Result Date: 10/10/2021 CLINICAL DATA:  Left hip pain, nontraumatic. EXAM: DG HIP (WITH OR WITHOUT PELVIS) 2-3V LEFT COMPARISON:  None Available. FINDINGS: There is no evidence of hip fracture or dislocation. There  is no evidence of arthropathy or other focal bone abnormality. Left-sided sacral stimulator device is present. IMPRESSION: Negative. Electronically Signed   By: Darliss Cheney M.D.   On: 10/10/2021 16:00   DG Lumbar Spine Complete  Result Date: 10/10/2021 CLINICAL DATA:  Nontraumatic left hip pain EXAM: LUMBAR SPINE - COMPLETE 4+ VIEW COMPARISON:  None Available. FINDINGS: There are 5 non-rib-bearing lumbar vertebrae. There is a left-sided nerve stimulator in the pelvis. There is no evidence of lumbar spine fracture. There is moderate to severe multilevel degenerative disc disease, prominent from L1 through L5 and worst at L2-L3 and L4-L5. There is moderate lower lumbar predominant facet arthropathy. Straightening of the lumbar lordosis. IMPRESSION: Moderate to severe multilevel degenerative disc disease, worst at L2-L3 and L4-L5. Moderate lower lumbar predominant facet arthropathy. No evidence of lumbar spine fracture. Electronically Signed   By:  Caprice Renshaw M.D.   On: 10/10/2021 15:55    Procedures Procedures  {Document cardiac monitor, telemetry assessment procedure when appropriate:1}  Medications Ordered in ED Medications  methocarbamol (ROBAXIN) tablet 500 mg (500 mg Oral Given 10/10/21 2331)    ED Course/ Medical Decision Making/ A&P                           Medical Decision Making Risk Prescription drug management.   ***  {Document critical care time when appropriate:1} {Document review of labs and clinical decision tools ie heart score, Chads2Vasc2 etc:1}  {Document your independent review of radiology images, and any outside records:1} {Document your discussion with family members, caretakers, and with consultants:1} {Document social determinants of health affecting pt's care:1} {Document your decision making why or why not admission, treatments were needed:1} Final Clinical Impression(s) / ED Diagnoses Final diagnoses:  None    Rx / DC Orders ED Discharge Orders     None

## 2021-10-10 NOTE — ED Triage Notes (Signed)
Patient complains of left hip pain since yesterday.  Patient complains pain in numerousjoints  Denies fall

## 2021-10-10 NOTE — ED Provider Triage Note (Signed)
Emergency Medicine Provider Triage Evaluation Note  [name] [facility] , a 76 y.o. male  was evaluated in triage.  Pt complains of left hip pain that started yesterday.  Patient states he stood up and had severe pain in the left hip, causing him to have to clutch the sofa armrest for support.  Pain radiates down the hip into the posterior thigh.  He denies falls or injuries.  He also is having some pain in the bilateral shoulders that has been ongoing for some time.  He denies numbness, tingling, lower extremity weakness, saddle paresthesia, back pain, neck pain.  Review of Systems  Positive:  Negative:   Physical Exam  BP 123/72   Pulse 83   Temp 99.9 F (37.7 C) (Oral)   Resp 14   Ht 5\' 8"  (1.727 m)   Wt 63.5 kg   SpO2 99%   BMI 21.29 kg/m  Gen:   Awake, no distress   Resp:  Normal effort  MSK:   Moves extremities without difficulty; no midline or paraspinal C-spine, T-spine or lumbar tenderness.  Focal tenderness to the left hip. Other:  Subjective sensation intact  Medical Decision Making  Medically screening exam initiated at 3:07 PM.  Appropriate orders placed.  Sylus Stgermain [facility] was informed that the remainder of the evaluation will be completed by another provider, this initial triage assessment does not replace that evaluation, and the importance of remaining in the ED until their evaluation is complete.     [facility], Janell Quiet 10/10/21 [phone]

## 2021-10-10 NOTE — ED Provider Notes (Signed)
[facility] EMERGENCY DEPARTMENT Provider Note   CSN: [id number] Arrival date & time: 10/10/21  1418     History  Chief Complaint  Patient presents with   Hip Pain    [name] is a 76 y.o. male.  With a history of arthritis, degenerative disc disease who presents to the ED for evaluation of left hip pain.  Patient reports pain started 3 days ago with gradual onset.  Reports he only feels the pain when he is walking.  Describes the pain as sharp without radiation.  Denies falls or injuries.  Reports he has not had this pain before.  Also reports mild right shoulder and trapezius pain that is only present with movement.  Denies numbness, weakness, tingling.   Hip Pain       Home Medications Prior to Admission medications   Medication Sig Start Date End Date Taking? Authorizing Provider  levothyroxine (SYNTHROID) 50 MCG tablet Take 50 mcg by mouth daily before breakfast. 03/18/21   [provider]      Allergies    Patient has no known allergies.    Review of Systems   Review of Systems  Musculoskeletal:  Positive for arthralgias, back pain and myalgias.    Physical Exam Updated Vital Signs BP 138/82 (BP Location: Left Arm)   Pulse 93   Temp 99.3 F (37.4 C) (Oral)   Resp 18   Ht 5\' 8"  (1.727 m)   Wt 63.5 kg   SpO2 100%   BMI 21.29 kg/m  Physical Exam Vitals and nursing note reviewed.  Constitutional:      General: He is not in acute distress.    Appearance: Normal appearance. He is normal weight. He is not ill-appearing.  HENT:     Head: Normocephalic and atraumatic.  Cardiovascular:     Pulses:          Radial pulses are 2+ on the right side and 2+ on the left side.       Dorsalis pedis pulses are 2+ on the right side and 2+ on the left side.  Pulmonary:     Effort: Pulmonary effort is normal. No respiratory distress.  Abdominal:     General: Abdomen is flat.  Musculoskeletal:        General: Normal range of motion.      Cervical back: Neck supple.     Right hip: Normal.     Left hip: Tenderness present. Normal range of motion.     Right lower leg: No edema.     Left lower leg: No edema.     Comments: Left hip tender at the trochanter and in the buttock  Skin:    General: Skin is warm and dry.  Neurological:     Mental Status: He is alert and oriented to person, place, and time.  Psychiatric:        Mood and Affect: Mood normal.        Behavior: Behavior normal.     ED Results / Procedures / Treatments   Labs (all labs ordered are listed, but only abnormal results are displayed) Labs Reviewed - No data to display  EKG None  Radiology DG Hip Unilat With Pelvis 2-3 Views Left  Result Date: 10/10/2021 CLINICAL DATA:  Left hip pain, nontraumatic. EXAM: DG HIP (WITH OR WITHOUT PELVIS) 2-3V LEFT COMPARISON:  None Available. FINDINGS: There is no evidence of hip fracture or dislocation. There is no evidence of arthropathy or other focal bone  abnormality. Left-sided sacral stimulator device is present. IMPRESSION: Negative. Electronically Signed   By: Darliss Cheney M.D.   On: 10/10/2021 16:00   DG Lumbar Spine Complete  Result Date: 10/10/2021 CLINICAL DATA:  Nontraumatic left hip pain EXAM: LUMBAR SPINE - COMPLETE 4+ VIEW COMPARISON:  None Available. FINDINGS: There are 5 non-rib-bearing lumbar vertebrae. There is a left-sided nerve stimulator in the pelvis. There is no evidence of lumbar spine fracture. There is moderate to severe multilevel degenerative disc disease, prominent from L1 through L5 and worst at L2-L3 and L4-L5. There is moderate lower lumbar predominant facet arthropathy. Straightening of the lumbar lordosis. IMPRESSION: Moderate to severe multilevel degenerative disc disease, worst at L2-L3 and L4-L5. Moderate lower lumbar predominant facet arthropathy. No evidence of lumbar spine fracture. Electronically Signed   By: Caprice Renshaw M.D.   On: 10/10/2021 15:55    Procedures Procedures     Medications Ordered in ED Medications  methocarbamol (ROBAXIN) tablet 500 mg (500 mg Oral Given 10/10/21 2331)    ED Course/ Medical Decision Making/ A&P Clinical Course as of 10/11/21 0015  Mon Oct 10, 2021  2348 DG Hip Unilat With Pelvis 2-3 Views Left I personally reviewed and interpreted the image.  No acute osseous abnormality. [AS]  2348 DG Lumbar Spine Complete I personally reviewed and interpreted the image.  No acute osseous abnormality.  Patient does have moderate degenerative disc disease and arthropathy. [AS]  Tue Oct 11, 2021  0011 Upon reevaluation, patient ambulates with the use of crutches.  States his pain is now 3 out of 10 with walking. [AS]    Clinical Course User Index [AS] Donyae Kilner, Edsel Petrin, PA-C                           Medical Decision Making Risk Prescription drug management.  This patient presents to the ED for concern of left hip pain, this involves an extensive number of treatment options, and is a complaint that carries with it a high risk of complications and morbidity.  The differential diagnosis includes fracture, muscle strain, arthropathy, radiculopathy, septic joint   Co morbidities that complicate the patient evaluation  Liver cirrhosis, chronic kidney disease, arthritis  My initial workup includes x-ray, left hip x-ray  Additional history obtained from: Nursing notes from this visit. EMS provides a portion of the history  I ordered imaging studies including x-ray lumbar, x-ray left I independently visualized and interpreted imaging which showed arthritic changes and degenerative disc disease, no acute bony abnormality I agree with the radiologist interpretation  Afebrile, hemodynamically stable.  Patient presenting with left hip pain for the past 3 days.  Reports pain is in the lateral aspect of the hip and in the buttock.  No radiation.  Pain is only present with ambulation and he describes it as sharp.  Full range of motion.   Strength 5 out of 5.  Moderate tenderness on palpation.  X-ray shows arthritic and chronic degenerative changes.  Physical exam and radiography most consistent with arthritic pain. patient has a history of cirrhosis, also has chronic kidney disease, making pain control challenging.  We trialed Robaxin here today, patient reports significant improvement in his symptoms and is now able to ambulate with ease.  Patient reports he has used crutches in the past.  We will give patient crutches today.  I will send patient home with a short-term prescription for Robaxin.  Patient did also complain of intermittent mild right shoulder  pain, but declined imaging at this time.  Patient did have full range of motion, full strength, minimal pain with overhead movement.  Stable at discharge.  At this time there does not appear to be any evidence of an acute emergency medical condition and the patient appears stable for discharge with appropriate outpatient follow up. Diagnosis was discussed with patient who verbalizes understanding of care plan and is agreeable to discharge. I have discussed return precautions with patient who verbalizes understanding. Patient encouraged to follow-up with their PCP within 1 week. All questions answered.  Patient's case discussed with Dr. Clayborne Dana who agrees with plan to discharge with follow-up.   Note: Portions of this report may have been transcribed using voice recognition software. Every effort was made to ensure accuracy; however, inadvertent computerized transcription errors may still be present.          Final Clinical Impression(s) / ED Diagnoses Final diagnoses:  None    Rx / DC Orders ED Discharge Orders     None         Mora Bellman 10/11/21 0026    Mesner, Barbara Cower, MD 10/11/21 [phone]

## 2021-10-11 MED ORDER — METHOCARBAMOL 500 MG PO TABS: 500.0000 mg | ORAL_TABLET | Freq: Two times a day (BID) | ORAL | 0 refills | Status: DC

## 2021-10-11 NOTE — Discharge Instructions (Addendum)
You have been seen today for your complaint of left hip pain. Your imaging showed no acute bony abnormalities. Your discharge medications include Robaxin.  This is a muscle relaxant.  You should take it as needed for pain. Home care instructions are as follows:  You should continue to walk like he normally would Follow up with: Your primary care provider next week Please seek immediate medical care if you develop any of the following symptoms: You fall. You have a sudden increase in pain and swelling in your hip. Your hip is red or swollen or very tender to touch. At this time there does not appear to be the presence of an emergent medical condition, however there is always the potential for conditions to change. Please read and follow the below instructions.  Do not take your medicine if  develop an itchy rash, swelling in your mouth or lips, or difficulty breathing; call 911 and seek immediate emergency medical attention if this occurs.  You may review your lab tests and imaging results in their entirety on your MyChart account.  Please discuss all results of fully with your primary care provider and other specialist at your follow-up visit.  Note: Portions of this text may have been transcribed using voice recognition software. Every effort was made to ensure accuracy; however, inadvertent computerized transcription errors may still be present.

## 2022-05-16 ENCOUNTER — Encounter (HOSPITAL_COMMUNITY): Payer: Self-pay | Admitting: *Deleted

## 2022-05-16 ENCOUNTER — Ambulatory Visit (INDEPENDENT_AMBULATORY_CARE_PROVIDER_SITE_OTHER): Payer: 59

## 2022-05-16 ENCOUNTER — Ambulatory Visit (HOSPITAL_COMMUNITY)
Admission: EM | Admit: 2022-05-16 | Discharge: 2022-05-16 | Disposition: A | Discharge status: Home / Self Care | Payer: 59 | Attending: Emergency Medicine | Admitting: Emergency Medicine

## 2022-05-16 DIAGNOSIS — M79645 Pain in left finger(s): Secondary | ICD-10-CM

## 2022-05-16 MED ORDER — ACETAMINOPHEN 325 MG PO TABS: 650.0000 mg | ORAL_TABLET | Freq: Three times a day (TID) | ORAL | 0 refills | Status: AC

## 2022-05-16 MED ORDER — METHYLPREDNISOLONE SODIUM SUCC 125 MG IJ SOLR
60.0000 mg | Freq: Once | INTRAMUSCULAR | Status: AC
  Administered 2022-05-16: 60 mg via INTRAMUSCULAR

## 2022-05-16 MED ORDER — METHYLPREDNISOLONE SODIUM SUCC 125 MG IJ SOLR
INTRAMUSCULAR | Status: AC
  Filled 2022-05-16: qty 2

## 2022-05-16 NOTE — ED Provider Notes (Signed)
[facility]    CSN: [id number] Arrival date & time: 05/16/22  1439      History   Chief Complaint Chief Complaint  Patient presents with   Hand Pain    HPI [name] is a 77 y.o. male.   Patient presents to clinic for left thumb pain that has been ongoing for the past month.  Unsure of any injury, denies recent falls.  Patient went over to his primary care today for evaluation and they sent him to the urgent care for imaging.  Limitation to flexion of his left thumb.  Some soft tissue swelling.  Patient has not taking anything for pain, reports his primary care took him off all of his medications.  Medical history shows a history of elevated liver enzymes.  The history is provided by the patient and medical records.  Hand Pain Pertinent negatives include no chest pain and no shortness of breath.    Past Medical History:  Diagnosis Date   Arthritis    KNEE,  SHOULDERS   COPD with emphysema    DDD (degenerative disc disease), lumbosacral    Dyslipidemia    History of prostate cancer    2008--  S/P  RADICAL RETROPUBIC PROSTATECTOMY   Urinary incontinence     Patient Active Problem List   Diagnosis Date Noted   Protein-calorie malnutrition, severe 11/24/2017   Rib fracture 11/23/2017   Pneumothorax on left 11/23/2017    Past Surgical History:  Procedure Laterality Date   ANTERIOR CERVICAL DECOMP/DISCECTOMY FUSION  07-18-2000   C3 -- C6   INGUINAL HERNIA REPAIR Bilateral LEFT  12-19-2007/   RIGHT  08-11-2008   INSERTION MALE SLING /  CYSTOSCOPY  12-01-2008   SUI   INTERSTIM IMPLANT PLACEMENT  08-08-2010   URGE INCONTINENCE-   INTERSTIM IMPLANT PLACEMENT N/A 05/20/2013   Procedure: REPLACEMENT INTERSTIM IMPLANT FIRST STAGE;  Surgeon: Martina Sinner, MD;  Location: [facility];  Service: Urology;  Laterality: N/A;   INTERSTIM IMPLANT PLACEMENT N/A 05/20/2013   Procedure: REPLACEMENT INTERSTIM IMPLANT SECOND STAGE;  Surgeon:  Martina Sinner, MD;  Location: [facility];  Service: Urology;  Laterality: N/A;   KNEE ARTHROSCOPY Left 2008   ROBOT ASSISTED LAPAROSCOPIC RADICAL PROSTATECTOMY  12-05-2006   W/ BILATERAL PELVIC LYMPH NODE DISSECTION   WRIST ARTHROSCOPY WITH DEBRIDEMENT Right 05-20-2003   AND OPEN HARDWARE REMOVAL       Home Medications    Prior to Admission medications   Medication Sig Start Date End Date Taking? Authorizing Provider  acetaminophen (TYLENOL) 325 MG tablet Take 2 tablets (650 mg total) by mouth in the morning, at noon, and at bedtime for 5 days. 05/16/22 05/21/22 Yes Rinaldo Ratel, [place], FNP  levothyroxine (SYNTHROID) 50 MCG tablet Take 50 mcg by mouth daily before breakfast. 03/18/21   [provider]  methocarbamol (ROBAXIN) 500 MG tablet Take 1 tablet (500 mg total) by mouth 2 (two) times daily. 10/11/21   Schutt, Edsel Petrin, PA-C    Family History History reviewed. No pertinent family history.  Social History Social History   Tobacco Use   Smoking status: Former    Types: Cigarettes   Smokeless tobacco: Current    Types: Chew   Tobacco comments:    CHEW TOBACCO SINCE AGE 25/ QUIT SMOKING AGE 74  Vaping Use   Vaping Use: Never used  Substance Use Topics   Alcohol use: Yes    Comment: pt reprots having one 12 oz can  today    Drug use: No    Comment: HX MARIJUANA (LAST USED 2009)     Allergies   Patient has no known allergies.   Review of Systems Review of Systems  Constitutional:  Negative for chills, fatigue and fever.  Respiratory:  Negative for cough and shortness of breath.   Cardiovascular:  Negative for chest pain.  Musculoskeletal:  Positive for joint swelling.     Physical Exam Triage Vital Signs ED Triage Vitals  Enc Vitals Group     BP 05/16/22 1449 120/70     Pulse Rate 05/16/22 1449 90     Resp 05/16/22 1449 18     Temp 05/16/22 1449 98.1 F (36.7 C)     Temp Source 05/16/22 1449 Oral     SpO2 05/16/22 1449 98 %      Weight --      Height --      Head Circumference --      Peak Flow --      Pain Score 05/16/22 1448 9     Pain Loc --      Pain Edu? --      Excl. in GC? --    No data found.  Updated Vital Signs BP 120/70 (BP Location: Right Arm)   Pulse 90   Temp 98.1 F (36.7 C) (Oral)   Resp 18   SpO2 98%   Visual Acuity Right Eye Distance:   Left Eye Distance:   Bilateral Distance:    Right Eye Near:   Left Eye Near:    Bilateral Near:     Physical Exam Vitals and nursing note reviewed.  Constitutional:      Appearance: Normal appearance.  HENT:     Head: Normocephalic and atraumatic.     Right Ear: External ear normal.     Left Ear: External ear normal.     Nose: Nose normal.     Mouth/Throat:     Mouth: Mucous membranes are moist.  Eyes:     Conjunctiva/sclera: Conjunctivae normal.  Cardiovascular:     Rate and Rhythm: Normal rate and regular rhythm.  Pulmonary:     Effort: Pulmonary effort is normal. No respiratory distress.  Musculoskeletal:        General: Swelling and tenderness present. No deformity or signs of injury.     Right hand: Normal.     Left hand: Swelling and tenderness present.     Comments: Tenderness over right proximal metacarpal and CMC joint. Mild edema. Limitation to thumb flexion on exam d/t pain. Unable to complete passive ROM d/t pain.   Skin:    General: Skin is warm and dry.     Capillary Refill: Capillary refill takes less than 2 seconds.  Neurological:     Mental Status: He is alert and oriented to person, place, and time.  Psychiatric:        Mood and Affect: Mood normal.        Behavior: Behavior normal. Behavior is cooperative.      UC Treatments / Results  Labs (all labs ordered are listed, but only abnormal results are displayed) Labs Reviewed - No data to display  EKG   Radiology DG Finger Thumb Left  Result Date: 05/16/2022 CLINICAL DATA:  Left thumb pain. EXAM: LEFT THUMB 2+V COMPARISON:  None Available.  FINDINGS: There is no evidence of fracture or dislocation. Arthritic changes of the first metatarsophalangeal and interphalangeal joints with joint space narrowing and marginal spurring. There also  mild arthritic changes of the first carpometacarpal joint. Soft tissue swelling about the first metacarpophalangeal joint. IMPRESSION: 1. No acute fracture or dislocation. 2. Mild arthritic changes of the first metatarsophalangeal and interphalangeal joints. Electronically Signed   By: Larose Hires D.O.   On: 05/16/2022 15:15    Procedures Procedures (including critical care time)  Medications Ordered in UC Medications  methylPREDNISolone sodium succinate (SOLU-MEDROL) 125 mg/2 mL injection 60 mg (has no administration in time range)    Initial Impression / Assessment and Plan / UC Course  I have reviewed the triage vital signs and the nursing notes.  Pertinent labs & imaging results that were available during my care of the patient were reviewed by me and considered in my medical decision making (see chart for details).  Vitals in triage reviewed, patient is hemodynamically stable.  Tenderness over right proximal metacarpal and CMC joint.  Limitation to thumb flexion on exam due to pain.  Imaging negative for fracture or dislocation, does have arthritis.  Will trial IM steroid injection in clinic as well as a short round of Tylenol for pain and inflammation, history of cirrhosis.  Advised to follow-up with his primary care to ensure resolution over the next week.  Return and follow-up precautions discussed, patient verbalized understanding, no questions at this time.      Final Clinical Impressions(s) / UC Diagnoses   Final diagnoses:  Pain of left thumb     Discharge Instructions      Your x-ray was negative for fracture or dislocation.  It did show signs of arthritis.  Please take Tylenol up to 3 times daily for the next 5 days for your pain.  We have given you a steroid injection in  clinic, this should help with some of the pain and inflammation.  Please follow-up with your primary care over the next week to ensure improvement.  Please return to clinic if you have any new or concerning symptoms.     ED Prescriptions     Medication Sig Dispense Auth. Provider   acetaminophen (TYLENOL) 325 MG tablet Take 2 tablets (650 mg total) by mouth in the morning, at noon, and at bedtime for 5 days. 30 tablet Kayde Atkerson, [place], [place]      PDMP not reviewed this encounter.   Megan Hayduk, [place] N, [place] 05/16/22 [phone]

## 2022-05-16 NOTE — Discharge Instructions (Signed)
Your x-ray was negative for fracture or dislocation.  It did show signs of arthritis.  Please take Tylenol up to 3 times daily for the next 5 days for your pain.  We have given you a steroid injection in clinic, this should help with some of the pain and inflammation.  Please follow-up with your primary care over the next week to ensure improvement.  Please return to clinic if you have any new or concerning symptoms.

## 2022-05-16 NOTE — ED Triage Notes (Signed)
Pt states he has left thumb pain and can't move it x 1 month. He states he wen tot his PCP today and seen a RN who advised him to come to urgent care.

## 2022-09-27 ENCOUNTER — Encounter: Payer: Self-pay | Admitting: Registered Nurse

## 2022-09-28 ENCOUNTER — Other Ambulatory Visit: Payer: Self-pay | Admitting: Registered Nurse

## 2022-09-28 DIAGNOSIS — K7460 Unspecified cirrhosis of liver: Secondary | ICD-10-CM

## 2022-10-12 ENCOUNTER — Ambulatory Visit
Admission: RE | Admit: 2022-10-12 | Discharge: 2022-10-12 | Disposition: A | Discharge status: Home / Self Care | Payer: 59 | Source: Ambulatory Visit | Attending: Registered Nurse | Admitting: Registered Nurse

## 2022-10-12 DIAGNOSIS — K7460 Unspecified cirrhosis of liver: Secondary | ICD-10-CM

## 2022-12-11 ENCOUNTER — Encounter: Payer: Self-pay | Admitting: Gastroenterology

## 2023-01-11 ENCOUNTER — Encounter: Payer: Self-pay | Admitting: Gastroenterology

## 2023-01-11 ENCOUNTER — Ambulatory Visit: Payer: 59 | Admitting: Gastroenterology

## 2023-01-11 VITALS — BP 122/60 | HR 69 | Ht 68.0 in | Wt 126.0 lb

## 2023-01-11 DIAGNOSIS — F1011 Alcohol abuse, in remission: Secondary | ICD-10-CM | POA: Diagnosis not present

## 2023-01-11 DIAGNOSIS — K7030 Alcoholic cirrhosis of liver without ascites: Secondary | ICD-10-CM

## 2023-01-11 DIAGNOSIS — D509 Iron deficiency anemia, unspecified: Secondary | ICD-10-CM | POA: Diagnosis not present

## 2023-01-11 DIAGNOSIS — Z1211 Encounter for screening for malignant neoplasm of colon: Secondary | ICD-10-CM

## 2023-01-11 NOTE — Patient Instructions (Addendum)
_______________________________________________________  If your blood pressure at your visit was 140/90 or greater, please contact your primary care physician to follow up on this.  _______________________________________________________  If you are age 77 or older, your body mass index should be between 23-30. Your Body mass index is 19.16 kg/m. If this is out of the aforementioned range listed, please consider follow up with your Primary Care Provider.  If you are age 17 or younger, your body mass index should be between 19-25. Your Body mass index is 19.16 kg/m. If this is out of the aformentioned range listed, please consider follow up with your Primary Care Provider.   ________________________________________________________  The [facility] providers would like to encourage you to use [facility] to communicate with providers for non-urgent requests or questions.  Due to long hold times on the telephone, sending your provider a message by [facility] may be a faster and more efficient way to get a response.  Please allow 48 business hours for a response.  Please remember that this is for non-urgent requests.  _______________________________________________________  It has been recommended to you by your physician that you have a(n) EGD/Colon completed. Per your request, we did not schedule the procedure(s) today. Please contact our office at [phone] end of December/January to see if we have March avaliable. You will be scheduled for a pre-visit and procedure at that time.  Avoid medications toxic to the liver and stay away from alcohol. No NSAID's   Low-Sodium Eating Plan Salt (sodium) helps you keep a healthy balance of fluids in your body. Too much sodium can raise your blood pressure. It can also cause fluid and waste to be held in your body. Your health care provider or dietitian may recommend a low-sodium eating plan if you have high blood pressure (hypertension), kidney disease, liver  disease, or heart failure. Eating less sodium can help lower your blood pressure and reduce swelling. It can also protect your heart, liver, and kidneys. What are tips for following this plan? Reading food labels  Check food labels for the amount of sodium per serving. If you eat more than one serving, you must multiply the listed amount by the number of servings. Choose foods with less than 140 milligrams (mg) of sodium per serving. Avoid foods with 300 mg of sodium or more per serving. Always check how much sodium is in a product, even if the label says "unsalted" or "no salt added." Shopping  Buy products labeled as "low-sodium" or "no salt added." Buy fresh foods. Avoid canned foods and pre-made or frozen meals. Avoid canned, cured, or processed meats. Buy breads that have less than 80 mg of sodium per slice. Cooking  Eat more home-cooked food. Try to eat less restaurant, buffet, and fast food. Try not to add salt when you cook. Use salt-free seasonings or herbs instead of table salt or sea salt. Check with your provider or pharmacist before using salt substitutes. Cook with plant-based oils, such as canola, sunflower, or olive oil. Meal planning When eating at a restaurant, ask if your food can be made with less salt or no salt. Avoid dishes labeled as brined, pickled, cured, or smoked. Avoid dishes made with soy sauce, miso, or teriyaki sauce. Avoid foods that have monosodium glutamate (MSG) in them. MSG may be added to some restaurant food, sauces, soups, bouillon, and canned foods. Make meals that can be grilled, baked, poached, roasted, or steamed. These are often made with less sodium. General information Try to limit your sodium  intake to 1,500-2,300 mg each day, or the amount told by your provider. What foods should I eat? Fruits Fresh, frozen, or canned fruit. Fruit juice. Vegetables Fresh or frozen vegetables. "No salt added" canned vegetables. "No salt added" tomato sauce  and paste. Low-sodium or reduced-sodium tomato and vegetable juice. Grains Low-sodium cereals, such as oats, puffed wheat and rice, and shredded wheat. Low-sodium crackers. Unsalted rice. Unsalted pasta. Low-sodium bread. Whole grain breads and whole grain pasta. Meats and other proteins Fresh or frozen meat, poultry, seafood, and fish. These should have no added salt. Low-sodium canned tuna and salmon. Unsalted nuts. Dried peas, beans, and lentils without added salt. Unsalted canned beans. Eggs. Unsalted nut butters. Dairy Milk. Soy milk. Cheese that is naturally low in sodium, such as ricotta cheese, fresh mozzarella, or Swiss cheese. Low-sodium or reduced-sodium cheese. Cream cheese. Yogurt. Seasonings and condiments Fresh and dried herbs and spices. Salt-free seasonings. Low-sodium mustard and ketchup. Sodium-free salad dressing. Sodium-free light mayonnaise. Fresh or refrigerated horseradish. Lemon juice. Vinegar. Other foods Homemade, reduced-sodium, or low-sodium soups. Unsalted popcorn and pretzels. Low-salt or salt-free chips. The items listed above may not be all the foods and drinks you can have. Talk to a dietitian to learn more. What foods should I avoid? Vegetables Sauerkraut, pickled vegetables, and relishes. Olives. [place] fries. Onion rings. Regular canned vegetables, except low-sodium or reduced-sodium items. Regular canned tomato sauce and paste. Regular tomato and vegetable juice. Frozen vegetables in sauces. Grains Instant hot cereals. Bread stuffing, pancake, and biscuit mixes. Croutons. Seasoned rice or pasta mixes. Noodle soup cups. Boxed or frozen macaroni and cheese. Regular salted crackers. Self-rising flour. Meats and other proteins Meat or fish that is salted, canned, smoked, spiced, or pickled. Precooked or cured meat, such as sausages or meat loaves. [name]. Ham. Pepperoni. Hot dogs. Corned beef. Chipped beef. Salt pork. Jerky. Pickled herring, anchovies, and sardines.  Regular canned tuna. Salted nuts. Dairy Processed cheese and cheese spreads. Hard cheeses. Cheese curds. Blue cheese. Feta cheese. String cheese. Regular cottage cheese. Buttermilk. Canned milk. Fats and oils Salted butter. Regular margarine. Ghee. Bacon fat. Seasonings and condiments Onion salt, garlic salt, seasoned salt, table salt, and sea salt. Canned and packaged gravies. Worcestershire sauce. Tartar sauce. Barbecue sauce. Teriyaki sauce. Soy sauce, including reduced-sodium soy sauce. Steak sauce. Fish sauce. Oyster sauce. Cocktail sauce. Horseradish that you find on the shelf. Regular ketchup and mustard. Meat flavorings and tenderizers. Bouillon cubes. Hot sauce. Pre-made or packaged marinades. Pre-made or packaged taco seasonings. Relishes. Regular salad dressings. Salsa. Other foods Salted popcorn and pretzels. Corn chips and puffs. Potato and tortilla chips. Canned or dried soups. Pizza. Frozen entrees and pot pies. The items listed above may not be all the foods and drinks you should avoid. Talk to a dietitian to learn more. This information is not intended to replace advice given to you by your health care provider. Make sure you discuss any questions you have with your health care provider. Document Revised: 02/02/2022 Document Reviewed: 02/02/2022 Elsevier Patient Education  2024 [employer].

## 2023-01-11 NOTE — Progress Notes (Signed)
Chief Complaint:   Referring Provider:  Annamarie Major, CRNP      ASSESSMENT AND PLAN;   #1.  Well compensated EtOH liver cirrhosis-no ascites, coagulopathy, encephalopathy or prior GI bleeding.Nl AFP. Nl plts (182K) but liver stiffness>20  #2. IDA  #3. CRC screening  Plan: -EGD/colon -Avoid hepatotoxic medications -Strict EtOH abstinence -Low-salt normal protein diet -No NSAIDs.   I discussed EGD/Colonoscopy- the indications, risks, alternatives and potential complications including, but not limited to bleeding, infection, reaction to meds, damage to internal organs, cardiac and/or pulmonary problems, and perforation requiring surgery. The possibility that significant findings could be missed was explained. All ? were answered. Pt consents to proceed. HPI:    [name] is a 77 y.o. male  With biopsy-proven EtOH liver cirrhosis Referred by Annamarie Major to get established-and to get EGD/colonoscopy.  No GI complaints. No nausea, vomiting, heartburn, regurgitation, odynophagia or dysphagia.  No significant diarrhea or constipation.  No melena or hematochezia. No unintentional weight loss. No abdominal pain.  No H/O itching, skin lesions, easy bruisability, intake of OTC meds including diet pills, herbal medications, anabolic steroids or Tylenol. There is no H/O blood transfusions, IVDA or FH of liver disease. No jaundice, dark urine or pale stools. No alcohol abuse x 1 yr  He does have-Labs 10/16/2022- hemoglobin of 10.4 MCV 83, platelets 182K. Nl PT INR 1.1, albumin 3.5.  Alk phos 134.  Normal AST/ALT 10/23.    On review of previous notes: -serologic testing with no evidence of infection or immunity to hepatitis A, B, or C autoimmune markers were negative, he had a mildly elevated IgG <2 times upper limits of normal, ceruloplasmin was within normal limits, he had elevated ferritin and transferrin saturation, genetic testing for hemochromatosis was negative, alpha-1  antitrypsin phenotype MM.  Patient underwent FibroScan in June 2023 reporting a median liver stiffness of 28.6 kPa and S1 steatosis.  Patient was initially noted to have hepatic steatosis on imaging in 2013. He has also had elevated liver enzymes since at least 2013 primarily hepatic in nature with ALT 2-5 times upper limits of normal. This prompted biopsy in 2023 to rule out contributing etiologies of liver disease. Biopsy was formally read at [facility] noting cirrhosis, mild macrovesicular steatosis, rare plasma cells, and no stainable iron.  He was started on lactulose by his primary care provider, the reason for this is not clear to me as patient denies overt confusion.  Stopped.  Patient denies any ongoing alcohol use.  Last alcohol use 06/2021   Past liver workup: Liver biopsy 09/06/2021: Stage IV fibrosis, mild macrovesicular steatosis with ballooning degeneration.  Mild portal and lobular predominantly lymphocytic inflammation.  Negative iron stain.  Findings consistent with liver cirrhosis due to toxic/metabolic liver disease.  Fibrosis staging: Fibroscan 07/28/2021: Median score 28.6 kPa  CAP score of 267 dB/m  Interquartile range 7% % indicating a valid study.  INTERPRETATION: F4 hepatic fibrosis Likely to have clinically significant portal hypertension S1 hepatic steatosis   US duplex-no focal liver lesions, patent portal vein with normal directional flow  Neg Peth, normal alpha 1 antitrypsin, ceruloplasmin.  Remote history of colonoscopy-15 to 20 years ago-does not remember where  Past Medical History:  Diagnosis Date   Arthritis    KNEE,  SHOULDERS   COPD with emphysema (HCC)    DDD (degenerative disc disease), lumbosacral    Dyslipidemia    History of prostate cancer    2008--  S/P  RADICAL RETROPUBIC PROSTATECTOMY   Urinary  incontinence     Past Surgical History:  Procedure Laterality Date   ANTERIOR CERVICAL DECOMP/DISCECTOMY FUSION  07-18-2000   C3 -- C6    INGUINAL HERNIA REPAIR Bilateral LEFT  12-19-2007/   RIGHT  08-11-2008   INSERTION MALE SLING /  CYSTOSCOPY  12-01-2008   SUI   INTERSTIM IMPLANT PLACEMENT  08-08-2010   URGE INCONTINENCE-   INTERSTIM IMPLANT PLACEMENT N/A 05/20/2013   Procedure: REPLACEMENT INTERSTIM IMPLANT FIRST STAGE;  Surgeon: Martina Sinner, MD;  Location: [facility];  Service: Urology;  Laterality: N/A;   INTERSTIM IMPLANT PLACEMENT N/A 05/20/2013   Procedure: REPLACEMENT INTERSTIM IMPLANT SECOND STAGE;  Surgeon: Martina Sinner, MD;  Location: [facility];  Service: Urology;  Laterality: N/A;   KNEE ARTHROSCOPY Left 2008   ROBOT ASSISTED LAPAROSCOPIC RADICAL PROSTATECTOMY  12-05-2006   W/ BILATERAL PELVIC LYMPH NODE DISSECTION   WRIST ARTHROSCOPY WITH DEBRIDEMENT Right 05-20-2003   AND OPEN HARDWARE REMOVAL    History reviewed. No pertinent family history.  Social History   Tobacco Use   Smoking status: Former    Types: Cigarettes   Smokeless tobacco: Current    Types: Chew   Tobacco comments:    CHEW TOBACCO SINCE AGE 23/ QUIT SMOKING AGE 55  Vaping Use   Vaping status: Never Used  Substance Use Topics   Alcohol use: Yes    Comment: pt reprots having one 12 oz can today    Drug use: No    Comment: HX MARIJUANA (LAST USED 2009)    Current Outpatient Medications  Medication Sig Dispense Refill   levothyroxine (SYNTHROID) 50 MCG tablet Take 50 mcg by mouth daily before breakfast.     thiamine (VITAMIN B1) 100 MG tablet Take 100 mg by mouth daily.     methocarbamol (ROBAXIN) 500 MG tablet Take 1 tablet (500 mg total) by mouth 2 (two) times daily. (Patient not taking: Reported on 01/11/2023) 10 tablet 0   No current facility-administered medications for this visit.    No Known Allergies  Review of Systems:  Constitutional: Denies fever, chills, diaphoresis, appetite change and fatigue.  HEENT: Denies photophobia, eye pain, redness, hearing loss, ear pain,  congestion, sore throat, rhinorrhea, sneezing, mouth sores, neck pain, neck stiffness and tinnitus.   Respiratory: Denies SOB, DOE, cough, chest tightness,  and wheezing.   Cardiovascular: Denies chest pain, palpitations and leg swelling.  Genitourinary: Denies dysuria, urgency, frequency, hematuria, flank pain and difficulty urinating.  Musculoskeletal: Denies myalgias, back pain, joint swelling, arthralgias and gait problem.  Skin: No rash.  Neurological: Denies dizziness, seizures, syncope, weakness, light-headedness, numbness and headaches.  Hematological: Denies adenopathy. Easy bruising, personal or family bleeding history  Psychiatric/Behavioral: No anxiety or depression     Physical Exam:    BP 122/60   Pulse 69   Ht 5\' 8"  (1.727 m)   Wt 126 lb (57.2 kg)   BMI 19.16 kg/m  Wt Readings from Last 3 Encounters:  01/11/23 126 lb (57.2 kg)  10/10/21 140 lb (63.5 kg)  09/06/21 140 lb (63.5 kg)   Constitutional:  Well-developed, in no acute distress. Psychiatric: Normal mood and affect. Behavior is normal. HEENT: Pupils normal.  Conjunctivae are normal. No scleral icterus. Neck supple.  Cardiovascular: Normal rate, regular rhythm. No edema Pulmonary/chest: Effort normal and breath sounds normal. No wheezing, rales or rhonchi. Abdominal: Soft, nondistended. Nontender. Bowel sounds active throughout. There are no masses palpable. No hepatomegaly. Rectal: Deferred Neurological: Alert and oriented to person place  and time. Skin: Skin is warm and dry. No rashes noted.  Data Reviewed: I have personally reviewed following labs and imaging studies  CBC:    Latest Ref Rng & Units 09/06/2021   11:38 AM 05/12/2019    8:34 PM 11/26/2017    6:32 AM  CBC  WBC 4.0 - 10.5 K/uL 5.7  4.2  5.8   Hemoglobin 13.0 - 17.0 g/dL 25.3  66.4  40.3   Hematocrit 39.0 - 52.0 % 34.2  34.7  32.7   Platelets 150 - 400 K/uL 210  176  262     CMP:    Latest Ref Rng & Units 05/12/2019    8:34 PM  11/22/2017   11:37 PM 09/19/2016    2:00 PM  CMP  Glucose 70 - 99 mg/dL 474  259  563   BUN 8 - 23 mg/dL 9  6  5    Creatinine 0.61 - 1.24 mg/dL 8.75  6.43  3.29   Sodium 135 - 145 mmol/L 138  136  137   Potassium 3.5 - 5.1 mmol/L 3.5  3.7  4.2   Chloride 98 - 111 mmol/L 100  105  104   CO2 22 - 32 mmol/L 18  19  22    Calcium 8.9 - 10.3 mg/dL 9.0  8.9  8.9   Total Protein 6.5 - 8.1 g/dL   7.1   Total Bilirubin 0.3 - 1.2 mg/dL   1.3   Alkaline Phos 38 - 126 U/L   69   AST 15 - 41 U/L   150   ALT 17 - 63 U/L   71         Edman Circle, MD 01/11/2023, 3:13 PM  Cc: Annamarie Major, CRNP

## 2023-03-28 NOTE — Addendum Note (Signed)
 Addended by: Lurline Hare on: 03/28/2023 09:49 AM   Modules accepted: Orders

## 2023-04-02 ENCOUNTER — Ambulatory Visit (AMBULATORY_SURGERY_CENTER): Payer: 59 | Admitting: Gastroenterology

## 2023-04-02 ENCOUNTER — Encounter: Payer: Self-pay | Admitting: Gastroenterology

## 2023-04-02 VITALS — BP 139/82 | HR 79 | Temp 98.1°F | Resp 21 | Ht 68.0 in | Wt 126.0 lb

## 2023-04-02 DIAGNOSIS — D125 Benign neoplasm of sigmoid colon: Secondary | ICD-10-CM

## 2023-04-02 DIAGNOSIS — K5730 Diverticulosis of large intestine without perforation or abscess without bleeding: Secondary | ICD-10-CM

## 2023-04-02 DIAGNOSIS — Z1211 Encounter for screening for malignant neoplasm of colon: Secondary | ICD-10-CM

## 2023-04-02 DIAGNOSIS — K449 Diaphragmatic hernia without obstruction or gangrene: Secondary | ICD-10-CM | POA: Diagnosis not present

## 2023-04-02 DIAGNOSIS — K31A0 Gastric intestinal metaplasia, unspecified: Secondary | ICD-10-CM | POA: Diagnosis not present

## 2023-04-02 DIAGNOSIS — K2950 Unspecified chronic gastritis without bleeding: Secondary | ICD-10-CM

## 2023-04-02 DIAGNOSIS — D132 Benign neoplasm of duodenum: Secondary | ICD-10-CM | POA: Diagnosis not present

## 2023-04-02 DIAGNOSIS — K635 Polyp of colon: Secondary | ICD-10-CM | POA: Diagnosis not present

## 2023-04-02 DIAGNOSIS — D509 Iron deficiency anemia, unspecified: Secondary | ICD-10-CM

## 2023-04-02 DIAGNOSIS — K5520 Angiodysplasia of colon without hemorrhage: Secondary | ICD-10-CM

## 2023-04-02 DIAGNOSIS — K640 First degree hemorrhoids: Secondary | ICD-10-CM

## 2023-04-02 DIAGNOSIS — Z1212 Encounter for screening for malignant neoplasm of rectum: Secondary | ICD-10-CM

## 2023-04-02 MED ORDER — SODIUM CHLORIDE 0.9 % IV SOLN: 500.0000 mL | Freq: Once | INTRAVENOUS | Status: DC

## 2023-04-02 NOTE — Patient Instructions (Signed)
 Thank you for letting us care for your healthcare needs today! Please see handouts regarding Polyps, Diverticulosis, Hiatal Hernia, and Hemorrhoids. No Aspirin, ibuprofen, naproxen, or other NSAIDs drugs.   YOU HAD AN ENDOSCOPIC PROCEDURE TODAY AT THE [facility]:   Refer to the procedure report that was given to you for any specific questions about what was found during the examination.  If the procedure report does not answer your questions, please call your gastroenterologist to clarify.  If you requested that your care partner not be given the details of your procedure findings, then the procedure report has been included in a sealed envelope for you to review at your convenience later.  YOU SHOULD EXPECT: Some feelings of bloating in the abdomen. Passage of more gas than usual.  Walking can help get rid of the air that was put into your GI tract during the procedure and reduce the bloating. If you had a lower endoscopy (such as a colonoscopy or flexible sigmoidoscopy) you may notice spotting of blood in your stool or on the toilet paper. If you underwent a bowel prep for your procedure, you may not have a normal bowel movement for a few days.  Please Note:  You might notice some irritation and congestion in your nose or some drainage.  This is from the oxygen used during your procedure.  There is no need for concern and it should clear up in a day or so.  SYMPTOMS TO REPORT IMMEDIATELY:  Following lower endoscopy (colonoscopy or flexible sigmoidoscopy):  Excessive amounts of blood in the stool  Significant tenderness or worsening of abdominal pains  Swelling of the abdomen that is new, acute  Fever of 100F or higher  Following upper endoscopy (EGD)  Vomiting of blood or coffee ground material  New chest pain or pain under the shoulder blades  Painful or persistently difficult swallowing  New shortness of breath  Fever of 100F or higher  Black, tarry-looking stools  For  urgent or emergent issues, a gastroenterologist can be reached at any hour by calling [phone]. Do not use MyChart messaging for urgent concerns.    DIET:  We do recommend a small meal at first, but then you may proceed to your regular diet.  Drink plenty of fluids but you should avoid alcoholic beverages for 24 hours.  ACTIVITY:  You should plan to take it easy for the rest of today and you should NOT DRIVE or use heavy machinery until tomorrow (because of the sedation medicines used during the test).    FOLLOW UP: Our staff will call the number listed on your records the next business day following your procedure.  We will call around 7:15- 8:00 am to check on you and address any questions or concerns that you may have regarding the information given to you following your procedure. If we do not reach you, we will leave a message.     If any biopsies were taken you will be contacted by phone or by letter within the next 1-3 weeks.  Please call us at [phone] if you have not heard about the biopsies in 3 weeks.    SIGNATURES/CONFIDENTIALITY: You and/or your care partner have signed paperwork which will be entered into your electronic medical record.  These signatures attest to the fact that that the information above on your After Visit Summary has been reviewed and is understood.  Full responsibility of the confidentiality of this discharge information lies with you and/or your care-partner.

## 2023-04-02 NOTE — Op Note (Signed)
 [facility] Patient Name: [name] Procedure Date: 04/02/2023 8:23 AM MRN: [id number] Endoscopist: Lynann Bologna , MD, [id number] Age: 78 Referring MD:  Date of Birth: May 06, 1945 Gender: Male Account #: [id number] Procedure:                Colonoscopy Indications:              Iron deficiency anemia Medicines:                Monitored Anesthesia Care Procedure:                Pre-Anesthesia Assessment:                           - Prior to the procedure, a History and Physical                            was performed, and patient medications and                            allergies were reviewed. The patient's tolerance of                            previous anesthesia was also reviewed. The risks                            and benefits of the procedure and the sedation                            options and risks were discussed with the patient.                            All questions were answered, and informed consent                            was obtained. Prior Anticoagulants: The patient has                            taken no anticoagulant or antiplatelet agents. ASA                            Grade Assessment: III - A patient with severe                            systemic disease. After reviewing the risks and                            benefits, the patient was deemed in satisfactory                            condition to undergo the procedure.                           After obtaining informed consent, the colonoscope  was passed under direct vision. Throughout the                            procedure, the patient's blood pressure, pulse, and                            oxygen saturations were monitored continuously. The                            [id number]-HQ190L Colonoscope [id number] was introduced                            through the anus and advanced to the 2 cm into the                            ileum. The colonoscopy was performed  without                            difficulty. The patient tolerated the procedure                            well. The quality of the bowel preparation was                            good. The ileocecal valve, appendiceal orifice, and                            rectum were photographed. Scope In: 9:01:01 AM Scope Out: 9:16:42 AM Scope Withdrawal Time: 0 hours 10 minutes 53 seconds  Total Procedure Duration: 0 hours 15 minutes 41 seconds  Findings:                 Three sessile polyps were found in the mid sigmoid                            colon. The polyps were 4 to 6 mm in size. These                            polyps were removed with a cold snare. Resection                            and retrieval were complete.                           Two small localized angiodysplastic lesions without                            bleeding were found in the cecum, one in ascending                            colon. Not ablated as APC is not avaiable.                           Multiple medium-mouthed diverticula  were found in                            the sigmoid colon and ascending colon.                           Non-bleeding internal hemorrhoids were found during                            retroflexion. The hemorrhoids were small and Grade                            I (internal hemorrhoids that do not prolapse).                           The terminal ileum appeared normal.                           The exam was otherwise without abnormality on                            direct and retroflexion views. Complications:            No immediate complications. Estimated Blood Loss:     Estimated blood loss: none. Impression:               - Three 4 to 6 mm polyps in the mid sigmoid colon,                            removed with a cold snare. Resected and retrieved.                           - Nonbleeding AVMs                           - Diverticulosis in the sigmoid colon and in the                             ascending colon.                           - Non-bleeding internal hemorrhoids.                           - The examined portion of the ileum was normal.                           - The examination was otherwise normal on direct                            and retroflexion views. Recommendation:           - Patient has a contact number available for                            emergencies. The signs and symptoms of potential  delayed complications were discussed with the                            patient. Return to normal activities tomorrow.                            Written discharge instructions were provided to the                            patient.                           - Resume previous diet.                           - Continue present medications.                           - No aspirin, ibuprofen, naproxen, or other                            non-steroidal anti-inflammatory drugs.                           - Await pathology results.                           - If problems with IDA in future, repeat                            colonoscopy with APC @ [facility] or [facility].                           - The findings and recommendations were discussed                            with the patient's family. Lynann Bologna, MD 04/02/2023 9:27:08 AM This report has been signed electronically.

## 2023-04-02 NOTE — Progress Notes (Signed)
 Chief Complaint:   Referring Provider:  Lynann Bologna, MD      ASSESSMENT AND PLAN;   #1.  Well compensated EtOH liver cirrhosis-no ascites, coagulopathy, encephalopathy or prior GI bleeding.Nl AFP. Nl plts (182K) but liver stiffness>20  #2. IDA  #3. CRC screening  Plan: -EGD/colon -Avoid hepatotoxic medications -Strict EtOH abstinence -Low-salt normal protein diet -No NSAIDs.   I discussed EGD/Colonoscopy- the indications, risks, alternatives and potential complications including, but not limited to bleeding, infection, reaction to meds, damage to internal organs, cardiac and/or pulmonary problems, and perforation requiring surgery. The possibility that significant findings could be missed was explained. All ? were answered. Pt consents to proceed. HPI:    [name] is a 78 y.o. male  With biopsy-proven EtOH liver cirrhosis Referred by Annamarie Major to get established-and to get EGD/colonoscopy.  No GI complaints. No nausea, vomiting, heartburn, regurgitation, odynophagia or dysphagia.  No significant diarrhea or constipation.  No melena or hematochezia. No unintentional weight loss. No abdominal pain.  No H/O itching, skin lesions, easy bruisability, intake of OTC meds including diet pills, herbal medications, anabolic steroids or Tylenol. There is no H/O blood transfusions, IVDA or FH of liver disease. No jaundice, dark urine or pale stools. No alcohol abuse x 1 yr  He does have-Labs 10/16/2022- hemoglobin of 10.4 MCV 83, platelets 182K. Nl PT INR 1.1, albumin 3.5.  Alk phos 134.  Normal AST/ALT 10/23.    On review of previous notes: -serologic testing with no evidence of infection or immunity to hepatitis A, B, or C autoimmune markers were negative, he had a mildly elevated IgG <2 times upper limits of normal, ceruloplasmin was within normal limits, he had elevated ferritin and transferrin saturation, genetic testing for hemochromatosis was negative, alpha-1  antitrypsin phenotype MM.  Patient underwent FibroScan in June 2023 reporting a median liver stiffness of 28.6 kPa and S1 steatosis.  Patient was initially noted to have hepatic steatosis on imaging in 2013. He has also had elevated liver enzymes since at least 2013 primarily hepatic in nature with ALT 2-5 times upper limits of normal. This prompted biopsy in 2023 to rule out contributing etiologies of liver disease. Biopsy was formally read at [facility] noting cirrhosis, mild macrovesicular steatosis, rare plasma cells, and no stainable iron.  He was started on lactulose by his primary care provider, the reason for this is not clear to me as patient denies overt confusion.  Stopped.  Patient denies any ongoing alcohol use.  Last alcohol use 06/2021   Past liver workup: Liver biopsy 09/06/2021: Stage IV fibrosis, mild macrovesicular steatosis with ballooning degeneration.  Mild portal and lobular predominantly lymphocytic inflammation.  Negative iron stain.  Findings consistent with liver cirrhosis due to toxic/metabolic liver disease.  Fibrosis staging: Fibroscan 07/28/2021: Median score 28.6 kPa  CAP score of 267 dB/m  Interquartile range 7% % indicating a valid study.  INTERPRETATION: F4 hepatic fibrosis Likely to have clinically significant portal hypertension S1 hepatic steatosis   US duplex-no focal liver lesions, patent portal vein with normal directional flow  Neg Peth, normal alpha 1 antitrypsin, ceruloplasmin.  Remote history of colonoscopy-15 to 20 years ago-does not remember where  Past Medical History:  Diagnosis Date   Arthritis    KNEE,  SHOULDERS   COPD with emphysema (HCC)    DDD (degenerative disc disease), lumbosacral    Dyslipidemia    History of prostate cancer    2008--  S/P  RADICAL RETROPUBIC PROSTATECTOMY   Urinary  incontinence     Past Surgical History:  Procedure Laterality Date   ANTERIOR CERVICAL DECOMP/DISCECTOMY FUSION  07-18-2000   C3 -- C6    INGUINAL HERNIA REPAIR Bilateral LEFT  12-19-2007/   RIGHT  08-11-2008   INSERTION MALE SLING /  CYSTOSCOPY  12-01-2008   SUI   INTERSTIM IMPLANT PLACEMENT  08-08-2010   URGE INCONTINENCE-   INTERSTIM IMPLANT PLACEMENT N/A 05/20/2013   Procedure: REPLACEMENT INTERSTIM IMPLANT FIRST STAGE;  Surgeon: Martina Sinner, MD;  Location: [facility] [place];  Service: Urology;  Laterality: N/A;   INTERSTIM IMPLANT PLACEMENT N/A 05/20/2013   Procedure: REPLACEMENT INTERSTIM IMPLANT SECOND STAGE;  Surgeon: Martina Sinner, MD;  Location: [facility] ;  Service: Urology;  Laterality: N/A;   KNEE ARTHROSCOPY Left 2008   ROBOT ASSISTED LAPAROSCOPIC RADICAL PROSTATECTOMY  12-05-2006   W/ BILATERAL PELVIC LYMPH NODE DISSECTION   WRIST ARTHROSCOPY WITH DEBRIDEMENT Right 05-20-2003   AND OPEN HARDWARE REMOVAL    No family history on file.  Social History   Tobacco Use   Smoking status: Former    Types: Cigarettes   Smokeless tobacco: Current    Types: Chew   Tobacco comments:    CHEW TOBACCO SINCE AGE 37/ QUIT SMOKING AGE 12  Vaping Use   Vaping status: Never Used  Substance Use Topics   Alcohol use: Yes    Comment: pt reprots having one 12 oz can today    Drug use: No    Comment: HX MARIJUANA (LAST USED 2009)    Current Outpatient Medications  Medication Sig Dispense Refill   levothyroxine (SYNTHROID) 50 MCG tablet Take 50 mcg by mouth daily before breakfast.     methocarbamol (ROBAXIN) 500 MG tablet Take 1 tablet (500 mg total) by mouth 2 (two) times daily. (Patient not taking: Reported on 01/11/2023) 10 tablet 0   thiamine (VITAMIN B1) 100 MG tablet Take 100 mg by mouth daily.     No current facility-administered medications for this visit.    No Known Allergies  Review of Systems:  Constitutional: Denies fever, chills, diaphoresis, appetite change and fatigue.  HEENT: Denies photophobia, eye pain, redness, hearing loss, ear pain, congestion, sore throat,  rhinorrhea, sneezing, mouth sores, neck pain, neck stiffness and tinnitus.   Respiratory: Denies SOB, DOE, cough, chest tightness,  and wheezing.   Cardiovascular: Denies chest pain, palpitations and leg swelling.  Genitourinary: Denies dysuria, urgency, frequency, hematuria, flank pain and difficulty urinating.  Musculoskeletal: Denies myalgias, back pain, joint swelling, arthralgias and gait problem.  Skin: No rash.  Neurological: Denies dizziness, seizures, syncope, weakness, light-headedness, numbness and headaches.  Hematological: Denies adenopathy. Easy bruising, personal or family bleeding history  Psychiatric/Behavioral: No anxiety or depression     Physical Exam:    There were no vitals taken for this visit. Wt Readings from Last 3 Encounters:  01/11/23 126 lb (57.2 kg)  10/10/21 140 lb (63.5 kg)  09/06/21 140 lb (63.5 kg)   Constitutional:  Well-developed, in no acute distress. Psychiatric: Normal mood and affect. Behavior is normal. HEENT: Pupils normal.  Conjunctivae are normal. No scleral icterus. Neck supple.  Cardiovascular: Normal rate, regular rhythm. No edema Pulmonary/chest: Effort normal and breath sounds normal. No wheezing, rales or rhonchi. Abdominal: Soft, nondistended. Nontender. Bowel sounds active throughout. There are no masses palpable. No hepatomegaly. Rectal: Deferred Neurological: Alert and oriented to person place and time. Skin: Skin is warm and dry. No rashes noted.  Data Reviewed: I have personally reviewed following  labs and imaging studies  CBC:    Latest Ref Rng & Units 09/06/2021   11:38 AM 05/12/2019    8:34 PM 11/26/2017    6:32 AM  CBC  WBC 4.0 - 10.5 K/uL 5.7  4.2  5.8   Hemoglobin 13.0 - 17.0 g/dL 40.9  81.1  91.4   Hematocrit 39.0 - 52.0 % 34.2  34.7  32.7   Platelets 150 - 400 K/uL 210  176  262     CMP:    Latest Ref Rng & Units 05/12/2019    8:34 PM 11/22/2017   11:37 PM 09/19/2016    2:00 PM  CMP  Glucose 70 - 99 mg/dL  782  956  213   BUN 8 - 23 mg/dL 9  6  5    Creatinine 0.61 - 1.24 mg/dL 0.86  5.78  4.69   Sodium 135 - 145 mmol/L 138  136  137   Potassium 3.5 - 5.1 mmol/L 3.5  3.7  4.2   Chloride 98 - 111 mmol/L 100  105  104   CO2 22 - 32 mmol/L 18  19  22    Calcium 8.9 - 10.3 mg/dL 9.0  8.9  8.9   Total Protein 6.5 - 8.1 g/dL   7.1   Total Bilirubin 0.3 - 1.2 mg/dL   1.3   Alkaline Phos 38 - 126 U/L   69   AST 15 - 41 U/L   150   ALT 17 - 63 U/L   71         Edman Circle, MD 04/02/2023, 8:26 AM  Cc: Lynann Bologna, MD

## 2023-04-02 NOTE — Progress Notes (Signed)
 Pt resting comfortably. VSS. Airway intact. SBAR complete to RN. All questions answered.

## 2023-04-02 NOTE — Progress Notes (Signed)
 Called to room to assist during endoscopic procedure.  Patient ID and intended procedure confirmed with present staff. Received instructions for my participation in the procedure from the performing physician.

## 2023-04-02 NOTE — Op Note (Addendum)
 [facility] Patient Name: [name] Procedure Date: 04/02/2023 8:35 AM MRN: [id number] Endoscopist: Lynann Bologna , MD, [id number] Age: 77 Referring MD:  Date of Birth: Jul 30, 1945 Gender: Male Account #: [id number] Procedure:                Upper GI endoscopy Indications:              Iron deficiency anemia. H/O ETOH cirrhosis for EV                            screening. Medicines:                Monitored Anesthesia Care Procedure:                Pre-Anesthesia Assessment:                           - Prior to the procedure, a History and Physical                            was performed, and patient medications and                            allergies were reviewed. The patient's tolerance of                            previous anesthesia was also reviewed. The risks                            and benefits of the procedure and the sedation                            options and risks were discussed with the patient.                            All questions were answered, and informed consent                            was obtained. Prior Anticoagulants: The patient has                            taken no anticoagulant or antiplatelet agents. ASA                            Grade Assessment: III - A patient with severe                            systemic disease. After reviewing the risks and                            benefits, the patient was deemed in satisfactory                            condition to undergo the procedure.  After obtaining informed consent, the endoscope was                            passed under direct vision. Throughout the                            procedure, the patient's blood pressure, pulse, and                            oxygen saturations were monitored continuously. The                            Olympus scope [id number] was introduced through the                            mouth, and advanced to the second part of  duodenum.                            The upper GI endoscopy was accomplished without                            difficulty. The patient tolerated the procedure                            well. Scope In: Scope Out: Findings:                 The examined esophagus was normal. No esophageal                            varices or fundic varices                           A small hiatal hernia was present.                           Diffuse moderate inflammation characterized by                            congestion (edema) was found in the entire examined                            stomach. Biopsies were taken with a cold forceps                            for histology.                           The examined duodenum was normal. Biopsies for                            histology were taken with a cold forceps for                            evaluation of celiac disease. Complications:  No immediate complications. Estimated Blood Loss:     Estimated blood loss: none. Impression:               - Normal esophagus.                           - Small hiatal hernia.                           - Atrophic gastritis. Biopsied.                           - Normal examined duodenum. Biopsied.                           . Recommendation:           - Patient has a contact number available for                            emergencies. The signs and symptoms of potential                            delayed complications were discussed with the                            patient. Return to normal activities tomorrow.                            Written discharge instructions were provided to the                            patient.                           - Resume previous diet.                           - Continue present medications.                           - Await pathology results.                           - The findings and recommendations were discussed                            with the  patient's family. Lynann Bologna, MD 04/02/2023 9:21:58 AM This report has been signed electronically.

## 2023-04-03 ENCOUNTER — Telehealth: Payer: Self-pay

## 2023-04-03 NOTE — Telephone Encounter (Signed)
  Follow up Call-     04/02/2023    8:28 AM  Call back number  Post procedure Call Back phone  # [phone]  Permission to leave phone message Yes     Patient questions:  Do you have a fever, pain , or abdominal swelling? No. Pain Score  0 *  Have you tolerated food without any problems? Yes.    Have you been able to return to your normal activities? Yes.    Do you have any questions about your discharge instructions: Diet   No. Medications  No. Follow up visit  No.  Do you have questions or concerns about your Care? No.  Actions: * If pain score is 4 or above: No action needed, pain <4.

## 2023-04-04 LAB — SURGICAL PATHOLOGY

## 2023-04-12 ENCOUNTER — Encounter: Payer: Self-pay | Admitting: Gastroenterology

## 2023-04-16 ENCOUNTER — Other Ambulatory Visit: Payer: Self-pay | Admitting: Nurse Practitioner

## 2023-04-16 DIAGNOSIS — R748 Abnormal levels of other serum enzymes: Secondary | ICD-10-CM

## 2023-04-16 DIAGNOSIS — K7030 Alcoholic cirrhosis of liver without ascites: Secondary | ICD-10-CM

## 2023-04-25 ENCOUNTER — Ambulatory Visit
Admission: RE | Admit: 2023-04-25 | Discharge: 2023-04-25 | Disposition: A | Discharge status: Home / Self Care | Source: Ambulatory Visit | Attending: Nurse Practitioner

## 2023-04-25 DIAGNOSIS — K7030 Alcoholic cirrhosis of liver without ascites: Secondary | ICD-10-CM

## 2023-04-25 DIAGNOSIS — R748 Abnormal levels of other serum enzymes: Secondary | ICD-10-CM

## 2023-07-24 ENCOUNTER — Encounter (HOSPITAL_COMMUNITY): Payer: Self-pay

## 2023-07-24 ENCOUNTER — Emergency Department (HOSPITAL_COMMUNITY)
Admission: EM | Admit: 2023-07-24 | Discharge: 2023-07-24 | Disposition: A | Discharge status: Home / Self Care | Attending: Emergency Medicine | Admitting: Emergency Medicine

## 2023-07-24 ENCOUNTER — Emergency Department (HOSPITAL_COMMUNITY)

## 2023-07-24 ENCOUNTER — Other Ambulatory Visit: Payer: Self-pay

## 2023-07-24 DIAGNOSIS — Z8546 Personal history of malignant neoplasm of prostate: Secondary | ICD-10-CM | POA: Diagnosis not present

## 2023-07-24 DIAGNOSIS — F17210 Nicotine dependence, cigarettes, uncomplicated: Secondary | ICD-10-CM | POA: Insufficient documentation

## 2023-07-24 DIAGNOSIS — J441 Chronic obstructive pulmonary disease with (acute) exacerbation: Secondary | ICD-10-CM | POA: Insufficient documentation

## 2023-07-24 DIAGNOSIS — R112 Nausea with vomiting, unspecified: Secondary | ICD-10-CM

## 2023-07-24 DIAGNOSIS — E876 Hypokalemia: Secondary | ICD-10-CM | POA: Diagnosis not present

## 2023-07-24 DIAGNOSIS — R051 Acute cough: Secondary | ICD-10-CM

## 2023-07-24 LAB — BASIC METABOLIC PANEL WITH GFR
Anion gap: 11 (ref 5–15)
BUN: 7 mg/dL — ABNORMAL LOW (ref 8–23)
CO2: 23 mmol/L (ref 22–32)
Calcium: 9 mg/dL (ref 8.9–10.3)
Chloride: 103 mmol/L (ref 98–111)
Creatinine, Ser: 0.84 mg/dL (ref 0.61–1.24)
GFR, Estimated: >60 mL/min (ref >60)
Glucose, Bld: 100 mg/dL — ABNORMAL HIGH (ref 70–99)
Potassium: 2.9 mmol/L — ABNORMAL LOW (ref 3.5–5.1)
Sodium: 137 mmol/L (ref 135–145)

## 2023-07-24 LAB — TROPONIN I (HIGH SENSITIVITY)
Troponin I (High Sensitivity): 4 ng/L (ref <18)
Troponin I (High Sensitivity): 3 ng/L (ref <18)

## 2023-07-24 LAB — COMPREHENSIVE METABOLIC PANEL WITH GFR
ALT: 10 U/L (ref 0–44)
AST: 22 U/L (ref 15–41)
Albumin: 3.1 g/dL — ABNORMAL LOW (ref 3.5–5.0)
Alkaline Phosphatase: 80 U/L (ref 38–126)
Anion gap: 13 (ref 5–15)
BUN: 8 mg/dL (ref 8–23)
CO2: 22 mmol/L (ref 22–32)
Calcium: 9 mg/dL (ref 8.9–10.3)
Chloride: 101 mmol/L (ref 98–111)
Creatinine, Ser: 0.78 mg/dL (ref 0.61–1.24)
GFR, Estimated: >60 mL/min (ref >60)
Glucose, Bld: 124 mg/dL — ABNORMAL HIGH (ref 70–99)
Potassium: 3 mmol/L — ABNORMAL LOW (ref 3.5–5.1)
Sodium: 136 mmol/L (ref 135–145)
Total Bilirubin: 0.8 mg/dL (ref 0.0–1.2)
Total Protein: 7.6 g/dL (ref 6.5–8.1)

## 2023-07-24 LAB — RESP PANEL BY RT-PCR (RSV, FLU A&B, COVID)  RVPGX2
Influenza A by PCR: NEGATIVE
Influenza B by PCR: NEGATIVE
Resp Syncytial Virus by PCR: NEGATIVE
SARS Coronavirus 2 by RT PCR: NEGATIVE

## 2023-07-24 LAB — LIPASE, BLOOD: Lipase: 26 U/L (ref 11–51)

## 2023-07-24 LAB — CBC
HCT: 33.1 % — ABNORMAL LOW (ref 39.0–52.0)
Hemoglobin: 11 g/dL — ABNORMAL LOW (ref 13.0–17.0)
MCH: 27.5 pg (ref 26.0–34.0)
MCHC: 33.2 g/dL (ref 30.0–36.0)
MCV: 82.8 fL (ref 80.0–100.0)
Platelets: 175 10*3/uL (ref 150–400)
RBC: 4 MIL/uL — ABNORMAL LOW (ref 4.22–5.81)
RDW: 14.2 % (ref 11.5–15.5)
WBC: 5.6 10*3/uL (ref 4.0–10.5)
nRBC: 0 % (ref 0.0–0.2)

## 2023-07-24 MED ORDER — ALBUTEROL SULFATE HFA 108 (90 BASE) MCG/ACT IN AERS: 1.0000 | INHALATION_SPRAY | Freq: Four times a day (QID) | RESPIRATORY_TRACT | 0 refills | Status: AC | PRN

## 2023-07-24 MED ORDER — POTASSIUM CHLORIDE ER 10 MEQ PO TBCR: 10.0000 meq | EXTENDED_RELEASE_TABLET | Freq: Every day | ORAL | 0 refills | Status: AC

## 2023-07-24 MED ORDER — ALUM & MAG HYDROXIDE-SIMETH 200-200-20 MG/5ML PO SUSP
15.0000 mL | Freq: Once | ORAL | Status: AC
  Administered 2023-07-24: 15 mL via ORAL
  Filled 2023-07-24: qty 30

## 2023-07-24 MED ORDER — PANTOPRAZOLE SODIUM 40 MG PO TBEC: 40.0000 mg | DELAYED_RELEASE_TABLET | Freq: Every day | ORAL | 0 refills | Status: AC

## 2023-07-24 MED ORDER — ONDANSETRON HCL 4 MG/2ML IJ SOLN
4.0000 mg | Freq: Once | INTRAMUSCULAR | Status: AC
  Administered 2023-07-24: 4 mg via INTRAVENOUS
  Filled 2023-07-24: qty 2

## 2023-07-24 MED ORDER — POTASSIUM CHLORIDE CRYS ER 20 MEQ PO TBCR
40.0000 meq | EXTENDED_RELEASE_TABLET | Freq: Once | ORAL | Status: AC
  Administered 2023-07-24: 40 meq via ORAL
  Filled 2023-07-24: qty 2

## 2023-07-24 MED ORDER — GUAIFENESIN-DM 100-10 MG/5ML PO SYRP: 5.0000 mL | ORAL_SOLUTION | ORAL | 0 refills | Status: AC | PRN

## 2023-07-24 MED ORDER — AZITHROMYCIN 250 MG PO TABS: 250.0000 mg | ORAL_TABLET | Freq: Every day | ORAL | 0 refills | Status: AC

## 2023-07-24 MED ORDER — IPRATROPIUM-ALBUTEROL 0.5-2.5 (3) MG/3ML IN SOLN
3.0000 mL | Freq: Once | RESPIRATORY_TRACT | Status: AC
  Administered 2023-07-24: 3 mL via RESPIRATORY_TRACT
  Filled 2023-07-24: qty 3

## 2023-07-24 MED ORDER — IOHEXOL 350 MG/ML SOLN
75.0000 mL | Freq: Once | INTRAVENOUS | Status: AC | PRN
  Administered 2023-07-24: 75 mL via INTRAVENOUS

## 2023-07-24 MED ORDER — SUCRALFATE 1 G PO TABS: 1.0000 g | ORAL_TABLET | Freq: Three times a day (TID) | ORAL | 0 refills | Status: AC

## 2023-07-24 MED ORDER — PREDNISONE 20 MG PO TABS
40.0000 mg | ORAL_TABLET | Freq: Every day | ORAL | 0 refills | Status: AC
Start: 2023-07-24 — End: 2023-07-29

## 2023-07-24 MED ORDER — ONDANSETRON HCL 4 MG PO TABS: 4.0000 mg | ORAL_TABLET | ORAL | 0 refills | Status: AC | PRN

## 2023-07-24 NOTE — ED Provider Triage Note (Signed)
 Emergency Medicine Provider Triage Evaluation Note  Bannon Giammarco [facility] , a 78 y.o. male  was evaluated in triage.  Pt complains of cough productive of green sputum and chest pain since last evening.  Review of Systems  Positive: Chest pain Negative: Fever  Physical Exam  BP 115/64 (BP Location: Right Arm)   Pulse 83   Temp 98.6 F (37 C)   Resp 16   Ht 5' 8.5 (1.74 m)   Wt 68.9 kg   SpO2 97%   BMI 22.78 kg/m  Gen:   Awake, no distress   Resp:  Normal effort  MSK:   Moves extremities without difficulty  Other:    Medical Decision Making  Medically screening exam initiated at 9:42 AM.  Appropriate orders placed.  Lindsay Soulliere [facility] was informed that the remainder of the evaluation will be completed by another provider, this initial triage assessment does not replace that evaluation, and the importance of remaining in the ED until their evaluation is complete.     Towana Ozell BROCKS, MD 07/24/23 [phone]

## 2023-07-24 NOTE — Discharge Instructions (Addendum)
 It was a pleasure caring for you today in the emergency department.  Be sure to eat a bland diet over the next few days, drink plenty fluids, get plenty of rest.  Please follow-up with your PCP for recheck.  Please return to the emergency department for any worsening or worrisome symptoms.

## 2023-07-24 NOTE — ED Notes (Signed)
 PCR RT collected by RN, pt requested to self swab. Sample sent to lab

## 2023-07-24 NOTE — ED Provider Notes (Addendum)
 [place] EMERGENCY DEPARTMENT AT [facility] Provider Note  CSN: [id number] Arrival date & time: 07/24/23 9071  Chief Complaint(s) Chest Pain  HPI [name] is a 78 y.o. male with past medical history as below, significant for COPD, gastritis, HLD, malnutrition who presents to the ED with complaint of cough, chest pain/dyspnea  Symptoms began early this morning, coughing productive with green/yellow-colored mucus, some chest tightness while coughing and dyspnea.  When not coughing he has no chest tightness/pain or dyspnea.  He had posttussive emesis earlier which has since improved.  No fevers, chills, sick contacts recent travel.  No recent diet or medication changes.  No current discomfort.  No longer smoking  Past Medical History Past Medical History:  Diagnosis Date   Arthritis    KNEE,  SHOULDERS   Cirrhosis (HCC)    COPD with emphysema (HCC)    DDD (degenerative disc disease), lumbosacral    Dyslipidemia    History of prostate cancer    2008--  S/P  RADICAL RETROPUBIC PROSTATECTOMY   IDA (iron deficiency anemia)    Urinary incontinence    Patient Active Problem List   Diagnosis Date Noted   Protein-calorie malnutrition, severe 11/24/2017   Rib fracture 11/23/2017   Pneumothorax on left 11/23/2017   Home Medication(s) Prior to Admission medications   Medication Sig Start Date End Date Taking? Authorizing Provider  albuterol (VENTOLIN HFA) 108 (90 Base) MCG/ACT inhaler Inhale 1-2 puffs into the lungs every 6 (six) hours as needed for wheezing or shortness of breath. 07/24/23  Yes Elnor Savant A, DO  azithromycin (ZITHROMAX) 250 MG tablet Take 1 tablet (250 mg total) by mouth daily. Take first 2 tablets together, then 1 every day until finished. 07/24/23  Yes Elnor Savant A, DO  guaiFENesin-dextromethorphan (ROBITUSSIN DM) 100-10 MG/5ML syrup Take 5 mLs by mouth every 4 (four) hours as needed for cough. 07/24/23  Yes Elnor Savant A, DO  ondansetron  (ZOFRAN ) 4  MG tablet Take 1 tablet (4 mg total) by mouth every 4 (four) hours as needed for nausea or vomiting. 07/24/23  Yes Elnor Savant A, DO  pantoprazole (PROTONIX) 40 MG tablet Take 1 tablet (40 mg total) by mouth daily for 14 days. 07/24/23 08/07/23 Yes Elnor Savant A, DO  potassium chloride  (KLOR-CON ) 10 MEQ tablet Take 1 tablet (10 mEq total) by mouth daily for 3 days. 07/25/23 07/28/23 Yes Elnor Savant LABOR, DO  predniSONE (DELTASONE) 20 MG tablet Take 2 tablets (40 mg total) by mouth daily for 5 days. 07/24/23 07/29/23 Yes Elnor Savant A, DO  sucralfate (CARAFATE) 1 g tablet Take 1 tablet (1 g total) by mouth with breakfast, with lunch, and with evening meal for 7 days. 07/24/23 07/31/23 Yes Elnor Savant LABOR, DO  levothyroxine (SYNTHROID) 50 MCG tablet Take 50 mcg by mouth daily before breakfast. 03/18/21   [provider]  thiamine  (VITAMIN B1) 100 MG tablet Take 100 mg by mouth daily.    [provider]  Past Surgical History Past Surgical History:  Procedure Laterality Date   ANTERIOR CERVICAL DECOMP/DISCECTOMY FUSION  07-18-2000   C3 -- C6   INGUINAL HERNIA REPAIR Bilateral LEFT  12-19-2007/   RIGHT  08-11-2008   INSERTION MALE SLING /  CYSTOSCOPY  12-01-2008   SUI   INTERSTIM IMPLANT PLACEMENT  08-08-2010   URGE INCONTINENCE-   INTERSTIM IMPLANT PLACEMENT N/A 05/20/2013   Procedure: REPLACEMENT INTERSTIM IMPLANT FIRST STAGE;  Surgeon: Glendia DELENA Elizabeth, MD;  Location: [facility] [place];  Service: Urology;  Laterality: N/A;   INTERSTIM IMPLANT PLACEMENT N/A 05/20/2013   Procedure: REPLACEMENT INTERSTIM IMPLANT SECOND STAGE;  Surgeon: Glendia DELENA Elizabeth, MD;  Location: [facility] [place];  Service: Urology;  Laterality: N/A;   KNEE ARTHROSCOPY Left 2008   ROBOT ASSISTED LAPAROSCOPIC RADICAL PROSTATECTOMY  12-05-2006   W/ BILATERAL PELVIC LYMPH  NODE DISSECTION   WRIST ARTHROSCOPY WITH DEBRIDEMENT Right 05-20-2003   AND OPEN HARDWARE REMOVAL   Family History Family History  Problem Relation Age of Onset   Colon cancer Neg Hx    Esophageal cancer Neg Hx    Rectal cancer Neg Hx    Stomach cancer Neg Hx     Social History Social History   Tobacco Use   Smoking status: Former    Types: Cigarettes   Smokeless tobacco: Current    Types: Chew   Tobacco comments:    CHEW TOBACCO SINCE AGE 7/ QUIT SMOKING AGE 29  Vaping Use   Vaping status: Never Used  Substance Use Topics   Alcohol use: Not Currently    Comment: stopped drinking in 2024   Drug use: No    Comment: HX MARIJUANA (LAST USED 2009)   Allergies Patient has no known allergies.  Review of Systems A thorough review of systems was obtained and all systems are negative except as noted in the HPI and PMH.   Physical Exam Vital Signs  I have reviewed the triage vital signs BP (!) 146/86   Pulse 99   Temp 97.7 F (36.5 C) (Oral)   Resp 13   Ht 5' 8.5 (1.74 m)   Wt 68.9 kg   SpO2 100%   BMI 22.78 kg/m  Physical Exam Vitals and nursing note reviewed.  Constitutional:      General: He is not in acute distress.    Appearance: He is well-developed.     Comments: Frail  HENT:     Head: Normocephalic and atraumatic.     Right Ear: External ear normal.     Left Ear: External ear normal.     Mouth/Throat:     Mouth: Mucous membranes are moist.   Eyes:     General: No scleral icterus.   Cardiovascular:     Rate and Rhythm: Normal rate and regular rhythm.     Pulses: Normal pulses.     Heart sounds: Normal heart sounds.  Pulmonary:     Effort: Pulmonary effort is normal. No respiratory distress.     Breath sounds: Normal breath sounds.  Abdominal:     General: Abdomen is flat.     Palpations: Abdomen is soft.     Tenderness: There is no abdominal tenderness.   Musculoskeletal:     Cervical back: No rigidity.     Right lower leg: No edema.      Left lower leg: No edema.   Skin:    General: Skin is warm and dry.     Capillary Refill: Capillary refill takes  less than 2 seconds.   Neurological:     Mental Status: He is alert.   Psychiatric:        Mood and Affect: Mood normal.        Behavior: Behavior normal.     ED Results and Treatments Labs (all labs ordered are listed, but only abnormal results are displayed) Labs Reviewed  BASIC METABOLIC PANEL WITH GFR - Abnormal; Notable for the following components:      Result Value   Potassium 2.9 (*)    Glucose, Bld 100 (*)    BUN 7 (*)    All other components within normal limits  CBC - Abnormal; Notable for the following components:   RBC 4.00 (*)    Hemoglobin 11.0 (*)    HCT 33.1 (*)    All other components within normal limits  COMPREHENSIVE METABOLIC PANEL WITH GFR - Abnormal; Notable for the following components:   Potassium 3.0 (*)    Glucose, Bld 124 (*)    Albumin 3.1 (*)    All other components within normal limits  RESP PANEL BY RT-PCR (RSV, FLU A&B, COVID)  RVPGX2  LIPASE, BLOOD  TROPONIN I (HIGH SENSITIVITY)  TROPONIN I (HIGH SENSITIVITY)                                                                                                                          Radiology CT ABDOMEN PELVIS W CONTRAST Result Date: 07/24/2023 CLINICAL DATA:  Epigastric pain EXAM: CT ABDOMEN AND PELVIS WITH CONTRAST TECHNIQUE: Multidetector CT imaging of the abdomen and pelvis was performed using the standard protocol following bolus administration of intravenous contrast. RADIATION DOSE REDUCTION: This exam was performed according to the departmental dose-optimization program which includes automated exposure control, adjustment of the mA and/or kV according to patient size and/or use of iterative reconstruction technique. CONTRAST:  75mL OMNIPAQUE  IOHEXOL  350 MG/ML SOLN COMPARISON:  11/23/2017 FINDINGS: Lower chest: Previously seen left pneumothorax has resolved. Some scarring in  the right base is noted. Hepatobiliary: No focal liver abnormality is seen. No gallstones, gallbladder wall thickening, or biliary dilatation. Pancreas: Unremarkable. No pancreatic ductal dilatation or surrounding inflammatory changes. Spleen: Normal in size without focal abnormality. Adrenals/Urinary Tract: Adrenal glands are within normal limits. Kidneys demonstrate a normal enhancement pattern bilaterally. No renal calculi or obstructive changes are seen. The bladder is partially distended. Stomach/Bowel: No obstructive or inflammatory changes of colon are noted. Minimal diverticular change is seen. No diverticulitis is noted. The appendix is within normal limits. Small bowel and stomach are unremarkable. Vascular/Lymphatic: Aortic atherosclerosis. No enlarged abdominal or pelvic lymph nodes. Reproductive: Surgically removed. Other: No abdominal wall hernia or abnormality. No abdominopelvic ascites. Musculoskeletal: InterStim device is noted on the left. No acute bony abnormality is noted. IMPRESSION: No acute abnormality noted in the abdomen and pelvis. Electronically Signed   By: Oneil Devonshire M.D.   On: 07/24/2023 21:20   DG Chest 2 View Result Date: 07/24/2023 CLINICAL DATA:  Chest  pain. EXAM: CHEST - 2 VIEW COMPARISON:  January 31, 2018. FINDINGS: The heart size and mediastinal contours are within normal limits. Hyperexpansion of the lungs is noted. No acute pulmonary disease is noted. Minimal biapical scarring is noted. The visualized skeletal structures are unremarkable. IMPRESSION: No acute cardiopulmonary abnormality seen. Electronically Signed   By: Lynwood Landy Raddle M.D.   On: 07/24/2023 11:09    Pertinent labs & imaging results that were available during my care of the patient were reviewed by me and considered in my medical decision making (see MDM for details).  Medications Ordered in ED Medications  ipratropium-albuterol (DUONEB) 0.5-2.5 (3) MG/3ML nebulizer solution 3 mL (3 mLs Nebulization  Given 07/24/23 1815)  alum & mag hydroxide-simeth (MAALOX/MYLANTA) 200-200-20 MG/5ML suspension 15 mL (15 mLs Oral Given 07/24/23 1814)  potassium chloride  SA (KLOR-CON  M) CR tablet 40 mEq (40 mEq Oral Given 07/24/23 1813)  ondansetron  (ZOFRAN ) injection 4 mg (4 mg Intravenous Given 07/24/23 2031)  iohexol  (OMNIPAQUE ) 350 MG/ML injection 75 mL (75 mLs Intravenous Contrast Given 07/24/23 2106)                                                                                                                                     Procedures Procedures  (including critical care time)  Medical Decision Making / ED Course    Medical Decision Making:    MIKAEL SKODA is a 78 y.o. male with past medical history as below, significant for COPD, HLD, malnutrition who presents to the ED with complaint of cough, chest pain/dyspnea. The complaint involves an extensive differential diagnosis and also carries with it a high risk of complications and morbidity.  Serious etiology was considered. Ddx includes but is not limited to: In my evaluation of this patient's dyspnea my DDx includes, but is not limited to, pneumonia, pulmonary embolism, pneumothorax, pulmonary edema, metabolic acidosis, asthma, COPD, cardiac cause, anemia, anxiety, etc.    Complete initial physical exam performed, notably the patient was in no acute distress, no hypoxia, breathing comfortably on ambient air.    Reviewed and confirmed nursing documentation for past medical history, family history, social history.  Vital signs reviewed.     Brief summary:  78 year old male history as above including COPD, hyperlipidemia here with cough, dyspnea, chest pain, posttussive emesis Symptoms have improved since onset this morning He had episode of nausea while in the lobby but his emesis has resolved.  Cough is greatly improved    Clinical Course as of 07/24/23 2225  Tue Jul 24, 2023  1752 Hemoglobin(!): 11.0 Similar to prior [SG]  1830  Tolerating PO w/o difficulty  [SG]  2205 Potassium(!): 3.0 Improved from earlier in the day [SG]  2205 CT stable CXR stable [SG]  2214 Feeling better [SG]    Clinical Course User Index [SG] Elnor Jayson LABOR, DO     Patient reports he is feeling much better.  His labs and  imaging are stable. Patient's presentation today is likely multifactorial, he has history of lung COPD, possible mild COPD exacerbation, no hypoxia.  Does not meet criteria for admission at this time.  He is also having some posttussive emesis but does have history of gastritis.  Possible having exacerbation of his underlying gastritis.  Denies any recent alcohol use.  Will restart him on PPI and Carafate.  Will give antiemetic as well.  Give azithromycin, steroids and albuterol for possible sleep exacerbation.   The patient's chest pain is not suggestive of pulmonary embolus, cardiac ischemia, aortic dissection, pericarditis, myocarditis, pulmonary embolism, pneumothorax, pneumonia, Zoster, or esophageal perforation, or other serious etiology.  Historically not abrupt in onset, tearing or ripping, pulses symmetric. EKG nonspecific for ischemia/infarction. No dysrhythmias, brugada, WPW, prolonged QT noted.  >> Troponin negative x2. CXR reviewed. Labs without demonstration of acute pathology unless otherwise noted above. CP likely atypical 2/2 coughing as he is only having cp while coughing and shortly after coughing.  Patient in no distress and overall condition is stable. Detailed discussions were had with the patient/guardian regarding current findings, and need for close f/u with PCP or on call doctor. The patient/guardian has been instructed to return immediately if the symptoms worsen in any way for re-evaluation. Patient/guardian verbalized understanding and is in agreement with current care plan. All questions answered prior to discharge.             Additional history obtained: -Additional history obtained  from na -External records from outside source obtained and reviewed including: Chart review including previous notes, labs, imaging, consultation notes including  Primary care documentation GI documentation   Lab Tests: -I ordered, reviewed, and interpreted labs.   The pertinent results include:   Labs Reviewed  BASIC METABOLIC PANEL WITH GFR - Abnormal; Notable for the following components:      Result Value   Potassium 2.9 (*)    Glucose, Bld 100 (*)    BUN 7 (*)    All other components within normal limits  CBC - Abnormal; Notable for the following components:   RBC 4.00 (*)    Hemoglobin 11.0 (*)    HCT 33.1 (*)    All other components within normal limits  COMPREHENSIVE METABOLIC PANEL WITH GFR - Abnormal; Notable for the following components:   Potassium 3.0 (*)    Glucose, Bld 124 (*)    Albumin 3.1 (*)    All other components within normal limits  RESP PANEL BY RT-PCR (RSV, FLU A&B, COVID)  RVPGX2  LIPASE, BLOOD  TROPONIN I (HIGH SENSITIVITY)  TROPONIN I (HIGH SENSITIVITY)    Notable for hypokalemia  EKG   EKG Interpretation Date/Time:  Tuesday July 24 2023 09:26:36 EDT Ventricular Rate:  86 PR Interval:  140 QRS Duration:  80 QT Interval:  396 QTC Calculation: 473 R Axis:   91  Text Interpretation: Sinus rhythm with marked sinus arrhythmia Rightward axis Borderline ECG When compared with ECG of 12-May-2019 20:33, No significant change since last tracing Confirmed by Towana Sharper [phone]) on 07/24/2023 9:43:36 AM         Imaging Studies ordered: I ordered imaging studies including chest x-ray, CT abdomen pelvis I independently visualized the following imaging with scope of interpretation limited to determining acute life threatening conditions related to emergency care; findings noted above I agree with the radiologist interpretation If any imaging was obtained with contrast I closely monitored patient for any possible adverse reaction a/w contrast  administration in the emergency department  Medicines ordered and prescription drug management: Meds ordered this encounter  Medications   ipratropium-albuterol (DUONEB) 0.5-2.5 (3) MG/3ML nebulizer solution 3 mL   alum & mag hydroxide-simeth (MAALOX/MYLANTA) 200-200-20 MG/5ML suspension 15 mL   potassium chloride  SA (KLOR-CON  M) CR tablet 40 mEq   ondansetron  (ZOFRAN ) injection 4 mg   iohexol  (OMNIPAQUE ) 350 MG/ML injection 75 mL   azithromycin (ZITHROMAX) 250 MG tablet    Sig: Take 1 tablet (250 mg total) by mouth daily. Take first 2 tablets together, then 1 every day until finished.    Dispense:  6 tablet    Refill:  0   ondansetron  (ZOFRAN ) 4 MG tablet    Sig: Take 1 tablet (4 mg total) by mouth every 4 (four) hours as needed for nausea or vomiting.    Dispense:  12 tablet    Refill:  0   guaiFENesin-dextromethorphan (ROBITUSSIN DM) 100-10 MG/5ML syrup    Sig: Take 5 mLs by mouth every 4 (four) hours as needed for cough.    Dispense:  118 mL    Refill:  0   albuterol (VENTOLIN HFA) 108 (90 Base) MCG/ACT inhaler    Sig: Inhale 1-2 puffs into the lungs every 6 (six) hours as needed for wheezing or shortness of breath.    Dispense:  1 each    Refill:  0   predniSONE (DELTASONE) 20 MG tablet    Sig: Take 2 tablets (40 mg total) by mouth daily for 5 days.    Dispense:  10 tablet    Refill:  0   sucralfate (CARAFATE) 1 g tablet    Sig: Take 1 tablet (1 g total) by mouth with breakfast, with lunch, and with evening meal for 7 days.    Dispense:  21 tablet    Refill:  0   pantoprazole (PROTONIX) 40 MG tablet    Sig: Take 1 tablet (40 mg total) by mouth daily for 14 days.    Dispense:  14 tablet    Refill:  0   potassium chloride  (KLOR-CON ) 10 MEQ tablet    Sig: Take 1 tablet (10 mEq total) by mouth daily for 3 days.    Dispense:  3 tablet    Refill:  0    -I have reviewed the patients home medicines and have made adjustments as needed   Consultations Obtained: Not  applicable  Cardiac Monitoring: The patient was maintained on a cardiac monitor.  I personally viewed and interpreted the cardiac monitored which showed an underlying rhythm of: nsr Continuous pulse oximetry interpreted by myself, 98% on RA.    Social Determinants of Health:  Diagnosis or treatment significantly limited by social determinants of health: former smoker and alcohol use   Reevaluation: After the interventions noted above, I reevaluated the patient and found that they have improved  Co morbidities that complicate the patient evaluation  Past Medical History:  Diagnosis Date   Arthritis    KNEE,  SHOULDERS   Cirrhosis (HCC)    COPD with emphysema (HCC)    DDD (degenerative disc disease), lumbosacral    Dyslipidemia    History of prostate cancer    2008--  S/P  RADICAL RETROPUBIC PROSTATECTOMY   IDA (iron deficiency anemia)    Urinary incontinence       Dispostion: Disposition decision including need for hospitalization was considered, and patient discharged from emergency department.    Final Clinical Impression(s) / ED Diagnoses Final diagnoses:  Hypokalemia  Acute cough  COPD exacerbation (HCC)  Nausea and vomiting, unspecified vomiting type        Elnor Jayson LABOR, DO 07/24/23 2225    Elnor Jayson LABOR, DO 07/24/23 2226

## 2023-07-24 NOTE — ED Triage Notes (Signed)
 Pt to ED via [facility]. Pt c/o chest pain w/coughing that started yesterday. Pt states he has started coughing and getting up yellow/green phlegm. Pt states he feels short of breath when this happens. Pt denies nausea or vomiting.

## 2023-10-17 ENCOUNTER — Other Ambulatory Visit: Payer: Self-pay | Admitting: Nurse Practitioner

## 2023-10-17 DIAGNOSIS — K7030 Alcoholic cirrhosis of liver without ascites: Secondary | ICD-10-CM

## 2023-10-17 DIAGNOSIS — R7989 Other specified abnormal findings of blood chemistry: Secondary | ICD-10-CM

## 2023-10-23 ENCOUNTER — Other Ambulatory Visit

## 2023-10-29 ENCOUNTER — Other Ambulatory Visit

## 2023-10-30 ENCOUNTER — Ambulatory Visit
Admission: RE | Admit: 2023-10-30 | Discharge: 2023-10-30 | Disposition: A | Discharge status: Home / Self Care | Source: Ambulatory Visit | Attending: Nurse Practitioner | Admitting: Nurse Practitioner

## 2023-10-30 DIAGNOSIS — K7030 Alcoholic cirrhosis of liver without ascites: Secondary | ICD-10-CM

## 2023-10-30 DIAGNOSIS — R7989 Other specified abnormal findings of blood chemistry: Secondary | ICD-10-CM
